# Patient Record
Sex: Female | Born: 1990 | Race: White | Hispanic: No | Smoking: Current every day smoker
Health system: Southern US, Community
[De-identification: ages and names within clinical notes are randomized; demographics above are authoritative.]

## PROBLEM LIST (undated history)

## (undated) DIAGNOSIS — K047 Periapical abscess without sinus: Secondary | ICD-10-CM

## (undated) DIAGNOSIS — J45909 Unspecified asthma, uncomplicated: Secondary | ICD-10-CM

## (undated) DIAGNOSIS — R519 Headache, unspecified: Secondary | ICD-10-CM

## (undated) DIAGNOSIS — J181 Lobar pneumonia, unspecified organism: Secondary | ICD-10-CM

## (undated) DIAGNOSIS — F419 Anxiety disorder, unspecified: Secondary | ICD-10-CM

## (undated) DIAGNOSIS — R51 Headache: Secondary | ICD-10-CM

## (undated) DIAGNOSIS — F1310 Sedative, hypnotic or anxiolytic abuse, uncomplicated: Secondary | ICD-10-CM

## (undated) HISTORY — DX: Lobar pneumonia, unspecified organism: J18.1

## (undated) HISTORY — DX: Sedative, hypnotic or anxiolytic abuse, uncomplicated: F13.10

---

## 2014-06-30 DIAGNOSIS — K047 Periapical abscess without sinus: Secondary | ICD-10-CM

## 2014-06-30 HISTORY — DX: Periapical abscess without sinus: K04.7

## 2014-11-29 LAB — OB RESULTS CONSOLE ANTIBODY SCREEN: ANTIBODY SCREEN: NEGATIVE

## 2014-11-29 LAB — OB RESULTS CONSOLE RUBELLA ANTIBODY, IGM: Rubella: IMMUNE

## 2014-11-29 LAB — OB RESULTS CONSOLE HEPATITIS B SURFACE ANTIGEN: HEP B S AG: NEGATIVE

## 2014-11-29 LAB — OB RESULTS CONSOLE RPR: RPR: NONREACTIVE

## 2014-11-29 LAB — OB RESULTS CONSOLE ABO/RH: RH TYPE: POSITIVE

## 2014-11-29 LAB — OB RESULTS CONSOLE HIV ANTIBODY (ROUTINE TESTING): HIV: NONREACTIVE

## 2015-05-08 LAB — OB RESULTS CONSOLE GBS: STREP GROUP B AG: NEGATIVE

## 2015-05-08 LAB — OB RESULTS CONSOLE GC/CHLAMYDIA
Chlamydia: NEGATIVE
Gonorrhea: NEGATIVE

## 2015-05-16 ENCOUNTER — Inpatient Hospital Stay (HOSPITAL_COMMUNITY)
Admission: AD | Admit: 2015-05-16 | Discharge: 2015-05-16 | Disposition: A | Discharge status: Home / Self Care | Payer: Medicaid Other | Source: Ambulatory Visit | Attending: Obstetrics | Admitting: Obstetrics

## 2015-05-16 ENCOUNTER — Encounter (HOSPITAL_COMMUNITY): Payer: Self-pay | Admitting: *Deleted

## 2015-05-16 DIAGNOSIS — Z3493 Encounter for supervision of normal pregnancy, unspecified, third trimester: Secondary | ICD-10-CM | POA: Insufficient documentation

## 2015-05-16 DIAGNOSIS — N898 Other specified noninflammatory disorders of vagina: Secondary | ICD-10-CM | POA: Diagnosis not present

## 2015-05-16 DIAGNOSIS — O26893 Other specified pregnancy related conditions, third trimester: Secondary | ICD-10-CM

## 2015-05-16 HISTORY — DX: Headache, unspecified: R51.9

## 2015-05-16 HISTORY — DX: Periapical abscess without sinus: K04.7

## 2015-05-16 HISTORY — DX: Anxiety disorder, unspecified: F41.9

## 2015-05-16 HISTORY — DX: Headache: R51

## 2015-05-16 HISTORY — DX: Unspecified asthma, uncomplicated: J45.909

## 2015-05-16 NOTE — MAU Note (Signed)
States noticed leaking since around 1000, clear. Happened "a few times, but then was regular." States she feels cramping and contractions.

## 2015-05-16 NOTE — Discharge Instructions (Signed)

## 2015-05-16 NOTE — MAU Note (Signed)
Urine sent to lab 

## 2015-05-16 NOTE — MAU Provider Note (Signed)
  History     CSN: [id number]  Arrival date and time: 05/16/15 1743   None     Chief Complaint  Patient presents with  . ? leaking, cramping,contractions    HPI Brianna Maddox 24 y.o. G2P0010 @ 6854w2d presents to the MAU stating that she thinks that her water might be broke. She felt her panties wet a couple of times earlier today. Denies vaginal bleeding, LOF. Reports positive fetal movement.   Past Medical History  Diagnosis Date  . Asthma   . Anxiety   . Headache   . Abscessed tooth 2016    during pregnancy, had antibiotics    History reviewed. No pertinent past surgical history.  History reviewed. No pertinent family history.  Social History  Substance Use Topics  . Smoking status: Former Smoker    Quit date: 09/13/2014  . Smokeless tobacco: Never Used  . Alcohol Use: No    Allergies: Allergies not on file  No prescriptions prior to admission    Review of Systems  Constitutional: Negative for fever.  Genitourinary:       Vaginal discharge  All other systems reviewed and are negative.  Physical Exam   Blood pressure 126/80, pulse 75, temperature 99 F (37.2 C), temperature source Oral, resp. rate 18, height 5\' 6"  (1.676 m), weight 74.844 kg (165 lb).  Physical Exam  Nursing note and vitals reviewed. Constitutional: She is oriented to person, place, and time. She appears well-developed and well-nourished.  HENT:  Head: Normocephalic and atraumatic.  Cardiovascular: Normal rate.   Respiratory: Effort normal. No respiratory distress.  GI: Soft. There is no tenderness.  Genitourinary: Vagina normal.  Neurological: She is alert and oriented to person, place, and time.  Skin: Skin is warm and dry.  Psychiatric: She has a normal mood and affect. Her behavior is normal. Judgment and thought content normal.    MAU Course  Procedures  MDM Spec Exam reveals thick white discharge. No pooling. Fern Negative. Cervix is 3/75/-2. FHR cat 1 ; irregular mild  contractions  Assessment and Plan  Vaginal Discharge  Discharge to home  [facility] HospitalClemmons,Desani Sprung Grissett 05/16/2015, 7:04 PM

## 2015-05-23 ENCOUNTER — Encounter (HOSPITAL_COMMUNITY): Payer: Self-pay

## 2015-05-23 ENCOUNTER — Inpatient Hospital Stay (HOSPITAL_COMMUNITY): Payer: Medicaid Other | Admitting: Anesthesiology

## 2015-05-23 ENCOUNTER — Inpatient Hospital Stay (HOSPITAL_COMMUNITY)
Admission: AD | Admit: 2015-05-23 | Discharge: 2015-05-27 | DRG: 766 | Disposition: A | Discharge status: Home / Self Care | Payer: Medicaid Other | Source: Intra-hospital | Attending: Obstetrics and Gynecology | Admitting: Obstetrics and Gynecology

## 2015-05-23 DIAGNOSIS — F419 Anxiety disorder, unspecified: Secondary | ICD-10-CM | POA: Diagnosis present

## 2015-05-23 DIAGNOSIS — Z87891 Personal history of nicotine dependence: Secondary | ICD-10-CM

## 2015-05-23 DIAGNOSIS — Z833 Family history of diabetes mellitus: Secondary | ICD-10-CM

## 2015-05-23 DIAGNOSIS — O99344 Other mental disorders complicating childbirth: Secondary | ICD-10-CM | POA: Diagnosis present

## 2015-05-23 DIAGNOSIS — R51 Headache: Secondary | ICD-10-CM | POA: Diagnosis not present

## 2015-05-23 DIAGNOSIS — Z3A38 38 weeks gestation of pregnancy: Secondary | ICD-10-CM | POA: Diagnosis not present

## 2015-05-23 DIAGNOSIS — O323XX0 Maternal care for face, brow and chin presentation, not applicable or unspecified: Secondary | ICD-10-CM | POA: Diagnosis present

## 2015-05-23 LAB — CBC
HCT: 37.1 % (ref 36.0–46.0)
Hemoglobin: 13.2 g/dL (ref 12.0–15.0)
MCH: 33.4 pg (ref 26.0–34.0)
MCHC: 35.6 g/dL (ref 30.0–36.0)
MCV: 93.9 fL (ref 78.0–100.0)
PLATELETS: 127 10*3/uL — AB (ref 150–400)
RBC: 3.95 MIL/uL (ref 3.87–5.11)
RDW: 13.3 % (ref 11.5–15.5)
WBC: 16.1 10*3/uL — AB (ref 4.0–10.5)

## 2015-05-23 LAB — TYPE AND SCREEN
ABO/RH(D): B POS
Antibody Screen: NEGATIVE

## 2015-05-23 MED ORDER — OXYCODONE-ACETAMINOPHEN 5-325 MG PO TABS: 1.0000 | ORAL_TABLET | ORAL | Status: DC | PRN

## 2015-05-23 MED ORDER — LIDOCAINE-EPINEPHRINE (PF) 2 %-1:200000 IJ SOLN
INTRAMUSCULAR | Status: DC | PRN
  Administered 2015-05-23: 3 mL

## 2015-05-23 MED ORDER — ACETAMINOPHEN 325 MG PO TABS: 650.0000 mg | ORAL_TABLET | ORAL | Status: DC | PRN

## 2015-05-23 MED ORDER — LIDOCAINE HCL (PF) 1 % IJ SOLN
30.0000 mL | INTRAMUSCULAR | Status: DC | PRN
  Filled 2015-05-23: qty 30

## 2015-05-23 MED ORDER — LACTATED RINGERS IV SOLN
500.0000 mL | INTRAVENOUS | Status: DC | PRN
  Administered 2015-05-23 – 2015-05-24 (×3): 500 mL via INTRAVENOUS

## 2015-05-23 MED ORDER — BUTORPHANOL TARTRATE 1 MG/ML IJ SOLN: 1.0000 mg | INTRAMUSCULAR | Status: DC | PRN

## 2015-05-23 MED ORDER — INFLUENZA VAC SPLIT QUAD 0.5 ML IM SUSY
0.5000 mL | PREFILLED_SYRINGE | INTRAMUSCULAR | Status: DC
Start: 2015-05-24 — End: 2015-05-24
  Filled 2015-05-23: qty 0.5

## 2015-05-23 MED ORDER — LACTATED RINGERS IV SOLN
INTRAVENOUS | Status: DC
  Administered 2015-05-23: 19:00:00 via INTRAVENOUS
  Administered 2015-05-24: 125 mL/h via INTRAVENOUS
  Administered 2015-05-24: 08:00:00 via INTRAVENOUS

## 2015-05-23 MED ORDER — PHENYLEPHRINE 40 MCG/ML (10ML) SYRINGE FOR IV PUSH (FOR BLOOD PRESSURE SUPPORT): 80.0000 ug | PREFILLED_SYRINGE | INTRAVENOUS | Status: DC | PRN

## 2015-05-23 MED ORDER — CITRIC ACID-SODIUM CITRATE 334-500 MG/5ML PO SOLN
30.0000 mL | ORAL | Status: DC | PRN
  Filled 2015-05-23: qty 15

## 2015-05-23 MED ORDER — FENTANYL 2.5 MCG/ML BUPIVACAINE 1/10 % EPIDURAL INFUSION (WH - ANES)
INTRAMUSCULAR | Status: AC
  Filled 2015-05-23: qty 125

## 2015-05-23 MED ORDER — OXYTOCIN 40 UNITS IN LACTATED RINGERS INFUSION - SIMPLE MED
62.5000 mL/h | INTRAVENOUS | Status: DC
  Filled 2015-05-23: qty 1000

## 2015-05-23 MED ORDER — OXYCODONE-ACETAMINOPHEN 5-325 MG PO TABS: 2.0000 | ORAL_TABLET | ORAL | Status: DC | PRN

## 2015-05-23 MED ORDER — DIPHENHYDRAMINE HCL 50 MG/ML IJ SOLN
12.5000 mg | INTRAMUSCULAR | Status: DC | PRN
  Administered 2015-05-23: 12.5 mg via INTRAVENOUS
  Filled 2015-05-23: qty 1

## 2015-05-23 MED ORDER — PHENYLEPHRINE 40 MCG/ML (10ML) SYRINGE FOR IV PUSH (FOR BLOOD PRESSURE SUPPORT)
PREFILLED_SYRINGE | INTRAVENOUS | Status: AC
  Filled 2015-05-23: qty 20

## 2015-05-23 MED ORDER — EPHEDRINE 5 MG/ML INJ: 10.0000 mg | INTRAVENOUS | Status: DC | PRN

## 2015-05-23 MED ORDER — BUPIVACAINE HCL (PF) 0.25 % IJ SOLN
INTRAMUSCULAR | Status: DC | PRN
  Administered 2015-05-23 (×2): 4 mL via EPIDURAL

## 2015-05-23 MED ORDER — ONDANSETRON HCL 4 MG/2ML IJ SOLN
4.0000 mg | Freq: Four times a day (QID) | INTRAMUSCULAR | Status: DC | PRN
  Administered 2015-05-23: 4 mg via INTRAVENOUS
  Filled 2015-05-23: qty 2

## 2015-05-23 MED ORDER — FENTANYL 2.5 MCG/ML BUPIVACAINE 1/10 % EPIDURAL INFUSION (WH - ANES)
14.0000 mL/h | INTRAMUSCULAR | Status: DC | PRN
  Administered 2015-05-23 – 2015-05-24 (×3): 14 mL/h via EPIDURAL
  Filled 2015-05-23: qty 125

## 2015-05-23 MED ORDER — OXYTOCIN BOLUS FROM INFUSION: 500.0000 mL | INTRAVENOUS | Status: DC

## 2015-05-23 NOTE — Anesthesia Preprocedure Evaluation (Signed)
Anesthesia Evaluation  Patient identified by MRN, date of birth, ID band Patient awake    Reviewed: Allergy & Precautions, Patient's Chart, lab work & pertinent test results  History of Anesthesia Complications Negative for: history of anesthetic complications  Airway Mallampati: II  TM Distance: >3 FB Neck ROM: Full    Dental  (+) Teeth Intact   Pulmonary neg pulmonary ROS, former smoker,    breath sounds clear to auscultation       Cardiovascular negative cardio ROS   Rhythm:Regular     Neuro/Psych  Headaches, Anxiety    GI/Hepatic negative GI ROS, Neg liver ROS,   Endo/Other  negative endocrine ROS  Renal/GU negative Renal ROS     Musculoskeletal   Abdominal   Peds  Hematology negative hematology ROS (+)   Anesthesia Other Findings   Reproductive/Obstetrics (+) Pregnancy                             Anesthesia Physical Anesthesia Plan  ASA: II  Anesthesia Plan: Epidural   Post-op Pain Management:    Induction:   Airway Management Planned:   Additional Equipment:   Intra-op Plan:   Post-operative Plan:   Informed Consent: I have reviewed the patients History and Physical, chart, labs and discussed the procedure including the risks, benefits and alternatives for the proposed anesthesia with the patient or authorized representative who has indicated his/her understanding and acceptance.   Dental advisory given  Plan Discussed with: Anesthesiologist  Anesthesia Plan Comments:         Anesthesia Quick Evaluation

## 2015-05-23 NOTE — H&P (Signed)
Brianna Maddox is a 24 y.o. female presenting for labor  24 yo G2P0010 @ 38+2 presents for painful contractions and was found to be in labor. Her pregnancy has been uncomplicated.  History OB History    Gravida Para Term Preterm AB TAB SAB Ectopic Multiple Living   2    1 1     0     Past Medical History  Diagnosis Date  . Asthma   . Anxiety   . Headache   . Abscessed tooth 2016    during pregnancy, had antibiotics   Past Surgical History  Procedure Laterality Date  . No past surgeries     Family History: family history includes Diabetes in her father. Social History:  reports that she quit smoking about 8 months ago. She has never used smokeless tobacco. She reports that she does not drink alcohol or use illicit drugs.   Prenatal Transfer Tool  Maternal Diabetes: No Genetic Screening: Normal Maternal Ultrasounds/Referrals: Normal Fetal Ultrasounds or other Referrals:  None Maternal Substance Abuse:  No Significant Maternal Medications:  None Significant Maternal Lab Results:  None Other Comments:  None  ROS  Dilation: 5 Effacement (%): 90 Station: -1 Exam by:: [email or web address],rnc Blood pressure 124/80, pulse 66, temperature 97.8 F (36.6 C), temperature source Oral, resp. rate 20, height 5\' 6"  (1.676 m), weight 167 lb (75.751 kg). Exam Physical Exam  Prenatal labs: ABO, Rh: B/Positive/-- (06/01 0000) Antibody: Negative (06/01 0000) Rubella: Immune (06/01 0000) RPR: Nonreactive (06/01 0000)  HBsAg: Negative (06/01 0000)  HIV: Non-reactive (06/01 0000)  GBS: Negative (11/08 0000)   Assessment/Plan: 1) Admit 2) Epidural on request    Brianna Maddox H. 05/23/2015, 7:09 PM

## 2015-05-23 NOTE — MAU Note (Signed)
Pt c/o contractions increasing all day and getting regular and stronger since 5pm. Pt denies bleeding and leaking of fluid.

## 2015-05-23 NOTE — Anesthesia Procedure Notes (Signed)
Epidural Patient location during procedure: OB  Staffing Anesthesiologist: Racheal Mathurin Performed by: anesthesiologist   Preanesthetic Checklist Completed: patient identified, surgical consent, pre-op evaluation, timeout performed, IV checked, risks and benefits discussed and monitors and equipment checked  Epidural Patient position: sitting Prep: DuraPrep Patient monitoring: heart rate, cardiac monitor, continuous pulse ox and blood pressure Approach: midline Location: L3-L4 Injection technique: LOR saline  Needle:  Needle type: Tuohy  Needle gauge: 17 G Needle length: 9 cm Needle insertion depth: 5 cm Catheter type: closed end flexible Catheter size: 19 Gauge Catheter at skin depth: 10 cm Test dose: negative and 2% lidocaine with Epi 1:200 K  Assessment Events: blood not aspirated, injection not painful, no injection resistance, negative IV test and no paresthesia  Additional Notes Reason for block:procedure for pain   

## 2015-05-24 ENCOUNTER — Encounter (HOSPITAL_COMMUNITY): Payer: Self-pay

## 2015-05-24 ENCOUNTER — Encounter (HOSPITAL_COMMUNITY)
Admission: AD | Disposition: A | Discharge status: Home / Self Care | Payer: Self-pay | Source: Ambulatory Visit | Attending: Obstetrics and Gynecology

## 2015-05-24 LAB — RPR: RPR Ser Ql: NONREACTIVE

## 2015-05-24 LAB — CBC
HEMATOCRIT: 32.6 % — AB (ref 36.0–46.0)
HEMOGLOBIN: 11.5 g/dL — AB (ref 12.0–15.0)
MCH: 33.1 pg (ref 26.0–34.0)
MCHC: 35.3 g/dL (ref 30.0–36.0)
MCV: 93.9 fL (ref 78.0–100.0)
Platelets: 108 10*3/uL — ABNORMAL LOW (ref 150–400)
RBC: 3.47 MIL/uL — ABNORMAL LOW (ref 3.87–5.11)
RDW: 13.3 % (ref 11.5–15.5)
WBC: 19.2 10*3/uL — AB (ref 4.0–10.5)

## 2015-05-24 LAB — ABO/RH: ABO/RH(D): B POS

## 2015-05-24 SURGERY — Surgical Case
Anesthesia: Epidural

## 2015-05-24 MED ORDER — DIPHENHYDRAMINE HCL 50 MG/ML IJ SOLN: 12.5000 mg | INTRAMUSCULAR | Status: DC | PRN

## 2015-05-24 MED ORDER — PHENYLEPHRINE 40 MCG/ML (10ML) SYRINGE FOR IV PUSH (FOR BLOOD PRESSURE SUPPORT)
PREFILLED_SYRINGE | INTRAVENOUS | Status: AC
  Filled 2015-05-24: qty 10

## 2015-05-24 MED ORDER — LACTATED RINGERS IV SOLN: INTRAVENOUS | Status: DC

## 2015-05-24 MED ORDER — KETOROLAC TROMETHAMINE 30 MG/ML IJ SOLN: 30.0000 mg | Freq: Four times a day (QID) | INTRAMUSCULAR | Status: AC | PRN

## 2015-05-24 MED ORDER — NALBUPHINE HCL 10 MG/ML IJ SOLN: 5.0000 mg | INTRAMUSCULAR | Status: DC | PRN

## 2015-05-24 MED ORDER — SODIUM BICARBONATE 8.4 % IV SOLN
INTRAVENOUS | Status: AC
  Filled 2015-05-24: qty 50

## 2015-05-24 MED ORDER — TETANUS-DIPHTH-ACELL PERTUSSIS 5-2.5-18.5 LF-MCG/0.5 IM SUSP
0.5000 mL | Freq: Once | INTRAMUSCULAR | Status: DC
  Filled 2015-05-24: qty 0.5

## 2015-05-24 MED ORDER — SIMETHICONE 80 MG PO CHEW
80.0000 mg | CHEWABLE_TABLET | Freq: Three times a day (TID) | ORAL | Status: DC
  Administered 2015-05-24 – 2015-05-27 (×10): 80 mg via ORAL
  Filled 2015-05-24 (×14): qty 1

## 2015-05-24 MED ORDER — MORPHINE SULFATE (PF) 0.5 MG/ML IJ SOLN
INTRAMUSCULAR | Status: AC
  Filled 2015-05-24: qty 10

## 2015-05-24 MED ORDER — DIBUCAINE 1 % RE OINT
1.0000 "application " | TOPICAL_OINTMENT | RECTAL | Status: DC | PRN
  Filled 2015-05-24: qty 28

## 2015-05-24 MED ORDER — MEPERIDINE HCL 25 MG/ML IJ SOLN: 6.2500 mg | INTRAMUSCULAR | Status: DC | PRN

## 2015-05-24 MED ORDER — DIPHENHYDRAMINE HCL 25 MG PO CAPS
25.0000 mg | ORAL_CAPSULE | Freq: Four times a day (QID) | ORAL | Status: DC | PRN
  Filled 2015-05-24: qty 1

## 2015-05-24 MED ORDER — CEFAZOLIN SODIUM-DEXTROSE 2-3 GM-% IV SOLR
INTRAVENOUS | Status: DC | PRN
  Administered 2015-05-24: 2 g via INTRAVENOUS

## 2015-05-24 MED ORDER — SODIUM CHLORIDE 0.9 % IJ SOLN: 3.0000 mL | INTRAMUSCULAR | Status: DC | PRN

## 2015-05-24 MED ORDER — OXYTOCIN 10 UNIT/ML IJ SOLN
40.0000 [IU] | INTRAVENOUS | Status: DC | PRN
  Administered 2015-05-24: 40 [IU] via INTRAVENOUS

## 2015-05-24 MED ORDER — SENNOSIDES-DOCUSATE SODIUM 8.6-50 MG PO TABS
2.0000 | ORAL_TABLET | ORAL | Status: DC
Start: 2015-05-25 — End: 2015-05-27
  Administered 2015-05-24 – 2015-05-27 (×3): 2 via ORAL
  Filled 2015-05-24 (×5): qty 2

## 2015-05-24 MED ORDER — DIPHENHYDRAMINE HCL 25 MG PO CAPS
25.0000 mg | ORAL_CAPSULE | ORAL | Status: DC | PRN
  Filled 2015-05-24: qty 1

## 2015-05-24 MED ORDER — LACTATED RINGERS IV SOLN
INTRAVENOUS | Status: DC | PRN
  Administered 2015-05-24: 09:00:00 via INTRAVENOUS

## 2015-05-24 MED ORDER — PRENATAL MULTIVITAMIN CH
1.0000 | ORAL_TABLET | Freq: Every day | ORAL | Status: DC
Start: 2015-05-24 — End: 2015-05-27
  Administered 2015-05-25 – 2015-05-27 (×2): 1 via ORAL
  Filled 2015-05-24 (×4): qty 1

## 2015-05-24 MED ORDER — SIMETHICONE 80 MG PO CHEW
80.0000 mg | CHEWABLE_TABLET | ORAL | Status: DC
  Administered 2015-05-25 – 2015-05-27 (×2): 80 mg via ORAL
  Filled 2015-05-24 (×5): qty 1

## 2015-05-24 MED ORDER — WITCH HAZEL-GLYCERIN EX PADS: 1.0000 "application " | MEDICATED_PAD | CUTANEOUS | Status: DC | PRN

## 2015-05-24 MED ORDER — NALOXONE HCL 2 MG/2ML IJ SOSY
1.0000 ug/kg/h | PREFILLED_SYRINGE | INTRAVENOUS | Status: DC | PRN
  Filled 2015-05-24: qty 2

## 2015-05-24 MED ORDER — ZOLPIDEM TARTRATE 5 MG PO TABS: 5.0000 mg | ORAL_TABLET | Freq: Every evening | ORAL | Status: DC | PRN

## 2015-05-24 MED ORDER — NALBUPHINE HCL 10 MG/ML IJ SOLN: 5.0000 mg | Freq: Once | INTRAMUSCULAR | Status: DC | PRN

## 2015-05-24 MED ORDER — LANOLIN HYDROUS EX OINT: 1.0000 "application " | TOPICAL_OINTMENT | CUTANEOUS | Status: DC | PRN

## 2015-05-24 MED ORDER — OXYCODONE-ACETAMINOPHEN 5-325 MG PO TABS
1.0000 | ORAL_TABLET | ORAL | Status: DC | PRN
  Administered 2015-05-24 – 2015-05-27 (×7): 1 via ORAL
  Filled 2015-05-24 (×7): qty 1

## 2015-05-24 MED ORDER — IBUPROFEN 600 MG PO TABS
600.0000 mg | ORAL_TABLET | Freq: Four times a day (QID) | ORAL | Status: DC
  Administered 2015-05-24 – 2015-05-27 (×12): 600 mg via ORAL
  Filled 2015-05-24 (×12): qty 1

## 2015-05-24 MED ORDER — OXYCODONE-ACETAMINOPHEN 5-325 MG PO TABS
2.0000 | ORAL_TABLET | ORAL | Status: DC | PRN
Start: 2015-05-24 — End: 2015-05-27
  Administered 2015-05-25: 2 via ORAL
  Filled 2015-05-24 (×2): qty 2

## 2015-05-24 MED ORDER — ONDANSETRON HCL 4 MG/2ML IJ SOLN
4.0000 mg | Freq: Three times a day (TID) | INTRAMUSCULAR | Status: DC | PRN
  Filled 2015-05-24: qty 2

## 2015-05-24 MED ORDER — SODIUM BICARBONATE 8.4 % IV SOLN
INTRAVENOUS | Status: DC | PRN
  Administered 2015-05-24 (×3): 5 mL via EPIDURAL

## 2015-05-24 MED ORDER — ACETAMINOPHEN 325 MG PO TABS
650.0000 mg | ORAL_TABLET | ORAL | Status: DC | PRN
  Administered 2015-05-26: 650 mg via ORAL
  Filled 2015-05-24: qty 2

## 2015-05-24 MED ORDER — MENTHOL 3 MG MT LOZG
1.0000 | LOZENGE | OROMUCOSAL | Status: DC | PRN
  Filled 2015-05-24: qty 9

## 2015-05-24 MED ORDER — 0.9 % SODIUM CHLORIDE (POUR BTL) OPTIME
TOPICAL | Status: DC | PRN
  Administered 2015-05-24: 1000 mL

## 2015-05-24 MED ORDER — OXYTOCIN 10 UNIT/ML IJ SOLN
INTRAMUSCULAR | Status: AC
Start: 2015-05-24 — End: 2015-05-24
  Filled 2015-05-24: qty 4

## 2015-05-24 MED ORDER — LACTATED RINGERS IV SOLN
INTRAVENOUS | Status: DC | PRN
  Administered 2015-05-24 (×2): via INTRAVENOUS

## 2015-05-24 MED ORDER — PHENYLEPHRINE HCL 10 MG/ML IJ SOLN
INTRAMUSCULAR | Status: DC | PRN
  Administered 2015-05-24 (×2): 40 ug via INTRAVENOUS

## 2015-05-24 MED ORDER — CEFAZOLIN SODIUM-DEXTROSE 2-3 GM-% IV SOLR
INTRAVENOUS | Status: AC
  Filled 2015-05-24: qty 50

## 2015-05-24 MED ORDER — ACETAMINOPHEN 10 MG/ML IV SOLN
1000.0000 mg | Freq: Once | INTRAVENOUS | Status: AC
  Administered 2015-05-24: 1000 mg via INTRAVENOUS
  Filled 2015-05-24: qty 100

## 2015-05-24 MED ORDER — SIMETHICONE 80 MG PO CHEW
80.0000 mg | CHEWABLE_TABLET | ORAL | Status: DC | PRN
  Administered 2015-05-24: 80 mg via ORAL
  Filled 2015-05-24 (×2): qty 1

## 2015-05-24 MED ORDER — NALOXONE HCL 0.4 MG/ML IJ SOLN: 0.4000 mg | INTRAMUSCULAR | Status: DC | PRN

## 2015-05-24 MED ORDER — PROMETHAZINE HCL 25 MG/ML IJ SOLN: 6.2500 mg | INTRAMUSCULAR | Status: DC | PRN

## 2015-05-24 MED ORDER — MEPERIDINE HCL 25 MG/ML IJ SOLN
INTRAMUSCULAR | Status: AC
  Filled 2015-05-24: qty 1

## 2015-05-24 MED ORDER — OXYTOCIN 40 UNITS IN LACTATED RINGERS INFUSION - SIMPLE MED: 62.5000 mL/h | INTRAVENOUS | Status: AC

## 2015-05-24 MED ORDER — MEPERIDINE HCL 25 MG/ML IJ SOLN
INTRAMUSCULAR | Status: DC | PRN
  Administered 2015-05-24 (×2): 12.5 mg via INTRAVENOUS

## 2015-05-24 MED ORDER — MORPHINE SULFATE (PF) 0.5 MG/ML IJ SOLN
INTRAMUSCULAR | Status: DC | PRN
  Administered 2015-05-24: 3 mg via EPIDURAL

## 2015-05-24 MED ORDER — LACTATED RINGERS IV SOLN
INTRAVENOUS | Status: DC
  Administered 2015-05-24: 1 mL via INTRAVENOUS

## 2015-05-24 MED ORDER — SCOPOLAMINE 1 MG/3DAYS TD PT72: 1.0000 | MEDICATED_PATCH | Freq: Once | TRANSDERMAL | Status: DC

## 2015-05-24 MED ORDER — ONDANSETRON HCL 4 MG/2ML IJ SOLN
INTRAMUSCULAR | Status: AC
  Filled 2015-05-24: qty 2

## 2015-05-24 MED ORDER — LIDOCAINE-EPINEPHRINE (PF) 2 %-1:200000 IJ SOLN
INTRAMUSCULAR | Status: AC
  Filled 2015-05-24: qty 20

## 2015-05-24 SURGICAL SUPPLY — 32 items
CLAMP CORD UMBIL (MISCELLANEOUS) IMPLANT
CLOTH BEACON ORANGE TIMEOUT ST (SAFETY) ×2 IMPLANT
DRAPE SHEET LG 3/4 BI-LAMINATE (DRAPES) IMPLANT
DRSG OPSITE POSTOP 4X10 (GAUZE/BANDAGES/DRESSINGS) ×2 IMPLANT
DURAPREP 26ML APPLICATOR (WOUND CARE) ×2 IMPLANT
ELECT REM PT RETURN 9FT ADLT (ELECTROSURGICAL) ×2
ELECTRODE REM PT RTRN 9FT ADLT (ELECTROSURGICAL) ×1 IMPLANT
EXTRACTOR VACUUM M CUP 4 TUBE (SUCTIONS) IMPLANT
GLOVE BIOGEL PI IND STRL 6.5 (GLOVE) ×1 IMPLANT
GLOVE BIOGEL PI IND STRL 7.0 (GLOVE) ×1 IMPLANT
GLOVE BIOGEL PI INDICATOR 6.5 (GLOVE) ×1
GLOVE BIOGEL PI INDICATOR 7.0 (GLOVE) ×1
GLOVE ECLIPSE 6.5 STRL STRAW (GLOVE) ×2 IMPLANT
GOWN STRL REUS W/TWL LRG LVL3 (GOWN DISPOSABLE) ×4 IMPLANT
KIT ABG SYR 3ML LUER SLIP (SYRINGE) IMPLANT
NEEDLE HYPO 25X5/8 SAFETYGLIDE (NEEDLE) IMPLANT
NS IRRIG 1000ML POUR BTL (IV SOLUTION) ×2 IMPLANT
PACK C SECTION WH (CUSTOM PROCEDURE TRAY) ×2 IMPLANT
PAD ABD 7.5X8 STRL (GAUZE/BANDAGES/DRESSINGS) IMPLANT
PAD OB MATERNITY 4.3X12.25 (PERSONAL CARE ITEMS) ×2 IMPLANT
PENCIL SMOKE EVAC W/HOLSTER (ELECTROSURGICAL) ×2 IMPLANT
RTRCTR C-SECT PINK 25CM LRG (MISCELLANEOUS) ×2 IMPLANT
STAPLER VISISTAT 35W (STAPLE) ×2 IMPLANT
SUT MON AB 2-0 CT1 27 (SUTURE) ×2 IMPLANT
SUT MON AB 4-0 PS1 27 (SUTURE) IMPLANT
SUT PDS AB 0 CTX 60 (SUTURE) IMPLANT
SUT PLAIN 2 0 XLH (SUTURE) IMPLANT
SUT VIC AB 0 CTX 36 (SUTURE) ×4
SUT VIC AB 0 CTX36XBRD ANBCTRL (SUTURE) ×4 IMPLANT
SUT VIC AB 4-0 KS 27 (SUTURE) IMPLANT
TOWEL OR 17X24 6PK STRL BLUE (TOWEL DISPOSABLE) ×2 IMPLANT
TRAY FOLEY CATH SILVER 14FR (SET/KITS/TRAYS/PACK) ×2 IMPLANT

## 2015-05-24 NOTE — Anesthesia Postprocedure Evaluation (Signed)
Anesthesia Post Note  Patient: Brianna PoundJessica Lacko  Procedure(s) Performed: Procedure(s) (LRB): CESAREAN SECTION (N/A)  Patient location during evaluation: PACU Anesthesia Type: Epidural Level of consciousness: awake and alert Pain management: pain level controlled Vital Signs Assessment: post-procedure vital signs reviewed and stable Respiratory status: spontaneous breathing, nonlabored ventilation and respiratory function stable Cardiovascular status: stable Postop Assessment: No headache and No backache Anesthetic complications: no    Last Vitals:  Filed Vitals:   05/24/15 1323 05/24/15 1440  BP: 110/67 107/63  Pulse: 67 72  Temp: 36.7 C 36.7 C  Resp: 18 18    Last Pain:  Filed Vitals:   05/24/15 1450  PainSc: 0-No pain                 Shelton SilvasKevin D Latrise Bowland

## 2015-05-24 NOTE — Progress Notes (Signed)
Pt started pushing approx 5am.  By my check at 8:15 am no significant progress with significant caput.  Station above +2 station, so not a vacuum candidate.  Given caput also difficult to assess sutures to determine baby position.  Lack of progress discussed with pt and decision agreed upon to proceed with primary C/S.  Pt consented and discussing R/B/Exp.

## 2015-05-24 NOTE — Transfer of Care (Signed)
Immediate Anesthesia Transfer of Care Note  Patient: Brianna PoundJessica Maddox  Procedure(s) Performed: Procedure(s): CESAREAN SECTION (N/A)  Patient Location: PACU  Anesthesia Type:Epidural  Level of Consciousness: awake, alert , oriented and patient cooperative  Airway & Oxygen Therapy: Patient Spontanous Breathing  Post-op Assessment: Report given to RN and Post -op Vital signs reviewed and stable  Post vital signs: Reviewed and stable  Last Vitals:  Filed Vitals:   05/24/15 0822 05/24/15 0848  BP:  121/85  Pulse: 85 101  Temp:    Resp:      Complications: No apparent anesthesia complications

## 2015-05-24 NOTE — Anesthesia Postprocedure Evaluation (Signed)
Anesthesia Post Note  Patient: Brianna Maddox  Procedure(s) Performed: Procedure(s) (LRB): CESAREAN SECTION (N/A)  Patient location during evaluation: Mother Baby Anesthesia Type: Epidural Level of consciousness: awake and alert and oriented Pain management: pain level controlled Vital Signs Assessment: post-procedure vital signs reviewed and stable Respiratory status: spontaneous breathing Cardiovascular status: stable Postop Assessment: No headache, No backache, Patient able to bend at knees, No signs of nausea or vomiting and Adequate PO intake Anesthetic complications: no    Last Vitals:  Filed Vitals:   05/24/15 1440 05/24/15 1521  BP: 107/63 103/63  Pulse: 72 89  Temp: 36.7 C 36.7 C  Resp: 18 20    Last Pain:  Filed Vitals:   05/24/15 1535  PainSc: 4                  Amenah Tucci

## 2015-05-24 NOTE — Op Note (Signed)
NAME:  Brianna Maddox, Brianna Maddox             ACCOUNT NO.:  [id number]  MEDICAL RECORD NO.:  [id number]  LOCATION:  9102                          FACILITY:  [facility]  PHYSICIAN:  Philip Aspen, DO    DATE OF BIRTH:  [birth year]  DATE OF PROCEDURE:  05/24/2015 DATE OF DISCHARGE:                              OPERATIVE REPORT   PREOPERATIVE DIAGNOSIS:  Arrest of descent.  POSTOPERATIVE DIAGNOSES:  Arrest of descent, OP presentation, hyperextension with brow presentation.  PROCEDURE:  Low-transverse cesarean section.  SURGEON:  Philip Aspen, DO  ASSISTANT:  Shonna Chock, MD  ANESTHESIA:  Epidural.  IV FLUIDS:  2400 mL.  URINE OUTPUT:  650 mL.  ESTIMATED BLOOD LOSS:  800 mL.  SPECIMENS:  Cord blood.  COMPLICATIONS:  None.  FINDINGS:  Female infant.  Cephalic brow presentation with Apgars of 9 and 10 and weight 9 pounds 5 ounces.  DESCRIPTION OF PROCEDURE:  After patient had been pushing for more than 3 hours with little advancement of head noted in the final hour of her pushing.  By my exam, the patient was consented for C-section and brought to the operating room.  She was prepped and draped in the normal sterile fashion in dorsal supine position with a leftward tilt.  A Pfannenstiel skin incision was made with a scalpel and carried down to the underlying layer of fascia with Bovie cautery.  Fascia was incised in the midline with the scalpel and extended laterally with Mayo scissors.  Kocher clamps were placed at the superior aspect of the fascial incision, and rectus muscles were dissected off bluntly and sharply.  Kocher clamps were then placed at the inferior aspect of the fascial incision, and rectus muscles were dissected off bluntly and sharply.  Rectus muscles were separated at the midline with hemostats and the peritoneum was entered bluntly.  The incision was extended by manual lateral traction.  The abdomen was manually surveyed.  Both tubes and ovaries were palpated and  found to be normal.  An Alexis self- retractor was placed, and the vesicouterine peritoneum was tented, entered sharply with Metzenbaum scissors, and extended laterally, and creating a bladder flap digitally.  A low transverse cesarean incision was made and the amniotic sac bulged through that incision, was entered sharply and the incision was extended by caudal and cephalic traction. The infant's head was low in the pelvis.  It was located, elevated with minimal difficulty, and the infant was then delivered without any further difficulty.  The cord was clamped and cut.  The infant was handed off to awaiting neonatology.  Cord blood was collected and external massage of the uterus with gentle traction on the umbilical cord resulted in delivery of the placenta intact.  The uterus was cleared of all clots and debris, and the uterine incision closed with Vicryl in a running locked fashion in the first layer and with horizontal Lambert imbrication in the second layer.  Excellent hemostasis was noted.  The [employer] was removed.  The peritoneum identified, reapproximated, and closed with Monocryl in a running fashion.  The fascia was then reapproximated and closed with Vicryl in a running fashion.  Subcutaneous tissue was irrigated, dried, and minimal use of  cautery was needed for control of small bleeders. The skin was reapproximated and closed with staples.  Sponge, laps, and needle counts were correct x2.  The patient tolerated the procedure well and was taken to recovery in stable condition.          ______________________________ Philip AspenSidney Shahd Occhipinti, DO     [place]/MEDQ  D:  05/24/2015  T:  05/24/2015  Job:  [id number]

## 2015-05-24 NOTE — Addendum Note (Signed)
Addendum  created 05/24/15 1640 by Renford DillsJanet L Baker Kogler, CRNA   Modules edited: Clinical Notes   Clinical Notes:  File: [id number]

## 2015-05-24 NOTE — Brief Op Note (Signed)
05/23/2015 - 05/24/2015  10:32 AM  PATIENT:  Brianna Maddox  24 y.o. female  PRE-OPERATIVE DIAGNOSIS:  Arrest of Descent  POST-OPERATIVE DIAGNOSIS:  Arrest of Descent, OP presentation, hyperextended with brow presentation  PROCEDURE:  Procedure(s): CESAREAN SECTION (N/A)  SURGEON:  Surgeon(s) and Role:    * Philip AspenSidney Genavive Kubicki, DO - Primary    * Kathrynn RunningNoah Bedford Wouk, MD - Assisting  ANESTHESIA:   epidural  EBL:  Total I/O In: 2400 [I.V.:2400] Out: 1450 [Urine:650; Blood:800]  SPECIMEN:  Source of Specimen:  cord blood  DISPOSITION OF SPECIMEN:  N/A  COUNTS:  YES  PLAN OF CARE: Admit to inpatient   PATIENT DISPOSITION:  PACU - hemodynamically stable.   Delay start of Pharmacological VTE agent (>24hrs) due to surgical blood loss or risk of bleeding: not applicable

## 2015-05-24 NOTE — Lactation Note (Signed)
This note was copied from the chart of Brianna Gavin PoundJessica Malena. Lactation Consultation Note  Patient Name: Brianna Maddox ZOXWR'UToday's Date: 05/24/2015 Reason for consult: Initial assessment Baby is 10 hrs old [facility]. Baby was born at 8837w3d & was 9+5.2# at birth. This is mom & FOBs first baby. Mom stated BF went well at the last feeding but before that had tried but he would not wake up to BF. Mom tried latching baby on left breast in football hold but baby would not wake up to BF. Demonstrated hand expression with mom & she was able to get colostrum right away. He licked her nipple but would not open wide and then was out. Left baby skin to skin with mom. Provided BF booklet, feeding logs, & BF resources; discussed LC number, outpatient services, & support groups. Mom stated she has WIC. Mom had many questions about timing of feedings (how often, length of BF), burping the baby, how to avoid engorgement, interested in pumping so dad can give bottles/ get a break, pacifier use, etc. Discussed all this with mom and encouraged mom to feed on demand or place baby skin to skin if it has been ~3.5hrs since last BF. Encouraged mom to [facility] for help with next latch.   Maternal Data Has patient been taught Hand Expression?: Yes Does the patient have breastfeeding experience prior to this delivery?: No  Feeding Feeding Type: Breast Fed Length of feed: 30 min (per mom)  LATCH Score/Interventions Latch: Too sleepy or reluctant, no latch achieved, no sucking elicited. Intervention(s): Skin to skin;Teach feeding cues  Audible Swallowing: None Intervention(s): Skin to skin;Hand expression  Type of Nipple: Everted at rest and after stimulation  Comfort (Breast/Nipple): Soft / non-tender     Hold (Positioning): Assistance needed to correctly position infant at breast and maintain latch. Intervention(s): Support Pillows;Skin to skin;Breastfeeding basics reviewed  LATCH  Score: 5  Lactation Tools Discussed/Used WIC Program: Yes   Consult Status Consult Status: Follow-up Date: 05/25/15 Follow-up type: In-patient    Oneal GroutLaura C Charnelle Bergeman 05/24/2015, 8:42 PM

## 2015-05-25 LAB — CBC
HCT: 29.9 % — ABNORMAL LOW (ref 36.0–46.0)
Hemoglobin: 10.4 g/dL — ABNORMAL LOW (ref 12.0–15.0)
MCH: 33.1 pg (ref 26.0–34.0)
MCHC: 34.8 g/dL (ref 30.0–36.0)
MCV: 95.2 fL (ref 78.0–100.0)
PLATELETS: 113 10*3/uL — AB (ref 150–400)
RBC: 3.14 MIL/uL — ABNORMAL LOW (ref 3.87–5.11)
RDW: 13.6 % (ref 11.5–15.5)
WBC: 15.6 10*3/uL — AB (ref 4.0–10.5)

## 2015-05-25 MED ORDER — BISACODYL 10 MG RE SUPP
10.0000 mg | Freq: Once | RECTAL | Status: AC
  Administered 2015-05-25: 10 mg via RECTAL
  Filled 2015-05-25 (×2): qty 1

## 2015-05-25 MED ORDER — ONDANSETRON HCL 4 MG PO TABS
8.0000 mg | ORAL_TABLET | Freq: Four times a day (QID) | ORAL | Status: DC
  Filled 2015-05-25: qty 2

## 2015-05-25 MED ORDER — PNEUMOCOCCAL VAC POLYVALENT 25 MCG/0.5ML IJ INJ
0.5000 mL | INJECTION | INTRAMUSCULAR | Status: AC
  Administered 2015-05-26: 0.5 mL via INTRAMUSCULAR
  Filled 2015-05-25: qty 0.5

## 2015-05-25 MED ORDER — INFLUENZA VAC SPLIT QUAD 0.5 ML IM SUSY
0.5000 mL | PREFILLED_SYRINGE | INTRAMUSCULAR | Status: AC
  Administered 2015-05-26: 0.5 mL via INTRAMUSCULAR

## 2015-05-25 MED ORDER — ONDANSETRON HCL 4 MG PO TABS
8.0000 mg | ORAL_TABLET | Freq: Four times a day (QID) | ORAL | Status: DC | PRN
  Administered 2015-05-25: 8 mg via ORAL

## 2015-05-25 NOTE — Lactation Note (Signed)
This note was copied from the chart of Brianna Maddox. Lactation Consultation Note  Patient Name: Brianna Maddox Reason for consult: Follow-up assessment;Other (Comment) (3% weight loss, ( 9.0.5 oz , ), LC enc mom to call when done  in  bathroom )  LC updated doc flow sheets. Voids and stools adequate for age. Baby breast feeding consistently - 18 -45 mins, Latch scores - 6-5-8 .  Mom needing to go to the bathroom and [facility] @ bedside. Mom to call when finished for feeding assessment.    Maternal Data    Feeding Feeding Type: Breast Fed Length of feed: 18 min  LATCH Score/Interventions Latch: Grasps breast easily, tongue down, lips flanged, rhythmical sucking.  Audible Swallowing: A few with stimulation  Type of Nipple: Everted at rest and after stimulation  Comfort (Breast/Nipple): Filling, red/small blisters or bruises, mild/mod discomfort  Problem noted: Mild/Moderate discomfort Interventions (Mild/moderate discomfort): Comfort gels  Hold (Positioning): No assistance needed to correctly position infant at breast. Intervention(s): Breastfeeding basics reviewed  LATCH Score: 8  Lactation Tools Discussed/Used     Consult Status Consult Status: Follow-up Date: 05/25/15 Follow-up type: In-patient    Kathrin Greathouseorio, Atlee Kluth Ann Maddox, 3:08 PM

## 2015-05-25 NOTE — Progress Notes (Signed)
Patient is doing well.  She is tolerating PO, ambulating, voiding.  Pain is controlled.  Lochia is appropriate  Filed Vitals:   05/24/15 1521 05/24/15 1900 05/24/15 2323 05/25/15 0355  BP: 103/63 95/61 107/58 91/59  Pulse: 89 87 83 75  Temp: 98.1 F (36.7 C) 98.2 F (36.8 C) 98 F (36.7 C) 98.1 F (36.7 C)  TempSrc:   Oral Oral  Resp: 20  18 18   Height:      Weight:      SpO2: 99%  99% 98%    NAD Abdomen:  soft, appropriate tenderness, incisions intact and without erythema or drainage ext:    Symmetric, no edema bilaterally  Lab Results  Component Value Date   WBC 15.6* 05/25/2015   HGB 10.4* 05/25/2015   HCT 29.9* 05/25/2015   MCV 95.2 05/25/2015   PLT 113* 05/25/2015    --/--/B POS, B POS (11/23 1920)/RImmune  A/P    23 y.o. G2P1010 POD 1 s/p 1 LTCS 2/2 arrest of descent Doing well.  Routine post op and postpartum care.   Declines circ

## 2015-05-25 NOTE — Clinical Social Work Maternal (Signed)
CLINICAL SOCIAL WORK MATERNAL/CHILD NOTE  Patient Details  Name: Brianna Maddox MRN: [id number] Date of Birth: 12-26-1990  Date:  05/25/2015  Clinical Social Worker Initiating Note:  Loleta Books, MSW, LCSW Date/ Time Initiated:  05/25/15/0915     Child's Name:  Brianna Maddox Age   Legal Guardian:  [name] and [name]  Need for Interpreter:  None   Date of Referral:  05/24/15     Reason for Referral:  History of anxiety  Referral Source:  [employer]   Address:  [address] 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  FOB was also present in the room, and was attending to the infant during the assessment.  RN accompanied CSW into MOB's room to offer pain medication.  MOB requested pain medication after she had an opportunity to eat.  MOB was pleasant, but mood and affect congruent for desire to receive pain medication. Due to pain level, level of engagement was impacted, and assessment was brief.   MOB endorsed presence of anxiety while in labor, and shared that she became nervous and scared when she learned that she needed a C-Section. She discussed have limited time to prepare, and reported that it was her first surgery.  MOB  shared that she felt less anxious and "better" once the procedure was over and she was able to see the infant; however, she noted that she continues to feel anxious when the infant makes noises since she wants to make sure that he is "okay".  MOB discussed how she has also noted that her anxiety is decreasing as she begins to learn the infant's behaviors, and that she is feeling more comfortable as he gets older.  MOB's comments highlight that she has self-awareness about her anxiety, and she smiled as her feelings of anxiety were normalized. CSW continued to provide education on impact of change in birth plan/fears during childbirth experience may impact long term mental health postpartum.     MOB reported long history of anxiety, but did not clarify onset of symptoms. Per MOB, she has previously been on Zoloft, but stated that it was "years ago".  MOB denied any increase in anxiety during the pregnancy.  She presented as attentive as CSW informed her of her increased risk for developing perinatal mood and anxiety disorders.  MOB expressed appreciation for the information and education.  She stated that she feels well supported by her support system, and discussed belief that she will be able to ask for help as needs arise.  MOB stated that she is also graduating college in the next month, but denied significant stress associated with finishing school since she notes that she has support from her  professors.  MOB denied concrete plans for post-gradation, and shared that she is taking it "one day at a time".  CSW validated and acknowledged normative range of emotions association with multiple changes (transition to motherhood, graduation, and [place]  MOB smiled as she acknowledged the numerous transitions, but did not indicate presence of lingering stress/anxieties related to these changes.   MOB denied questions, concerns, or needs at this time. She expressed appreciation  for the visit, acknowledged CSW availability, and agreed to contact CSW if needs arise during the admission.   CSW Plan/Description:   1)Patient/Family Education: Perinatal mood and anxiety disorders 2) Information/Referral to [facility] Resources: Feelings After Birth support group 3)No Further Intervention Required/No Barriers to Discharge    Kelby FamVenning, Yacine Droz N, LCSW 05/25/2015, 11:01 AM

## 2015-05-26 NOTE — Lactation Note (Signed)
This note was copied from the chart of Brianna Maddox Althaus. Lactation Consultation Note  Patient Name: Brianna Maddox Snider GNFAO'ZToday's Date: 05/26/2015 Reason for consult: Follow-up assessment Baby at 52 hr of life and has had a 9% wt loss. Mom has only been bf 6x/24hr. Encouraged mom to bf baby 8+/24hr. Mom demonstrated manual expression, colostrum noted bilaterally. She stated that she did not feed baby as much over night because she was not feeling well. Mom will bf the baby on demand, if baby does not latch she will pump, if she does not feel like she can bf or pump the baby will need a supplement. She does have the supplementing guidelines in her room. Mom is aware of OP services and support group.   Maternal Data    Feeding Feeding Type: Breast Fed Length of feed: 10 min  LATCH Score/Interventions Latch: Repeated attempts needed to sustain latch, nipple held in mouth throughout feeding, stimulation needed to elicit sucking reflex. Intervention(s): Skin to skin Intervention(s): Adjust position;Breast compression  Audible Swallowing: Spontaneous and intermittent Intervention(s): Hand expression  Type of Nipple: Everted at rest and after stimulation  Comfort (Breast/Nipple): Filling, red/small blisters or bruises, mild/mod discomfort  Problem noted: Mild/Moderate discomfort Interventions (Mild/moderate discomfort): Hand expression  Hold (Positioning): No assistance needed to correctly position infant at breast. Intervention(s): Position options  LATCH Score: 8  Lactation Tools Discussed/Used     Consult Status Consult Status: Follow-up Date: 05/27/15 Follow-up type: In-patient    Rulon Eisenmengerlizabeth E Niko Penson 05/26/2015, 3:00 PM

## 2015-05-26 NOTE — Progress Notes (Signed)
Patient is doing well.  She is tolerating PO, ambulating, voiding.  Pain is controlled.  Lochia is appropriate C/o cont gas pain, but improving since BM x 2 overnight  Filed Vitals:   05/24/15 2323 05/25/15 0355 05/25/15 1851 05/26/15 0614  BP: 107/58 91/59 103/62 108/70  Pulse: 83 75 75 67  Temp: 98 F (36.7 C) 98.1 F (36.7 C) 98.7 F (37.1 C) 98.6 F (37 C)  TempSrc: Oral Oral Oral Oral  Resp: 18 18 18 18   Height:      Weight:      SpO2: 99% 98%      NAD Abdomen:  soft, appropriate tenderness, incisions intact and without erythema or drainage ext:    Symmetric, no edema bilaterally  Lab Results  Component Value Date   WBC 15.6* 05/25/2015   HGB 10.4* 05/25/2015   HCT 29.9* 05/25/2015   MCV 95.2 05/25/2015   PLT 113* 05/25/2015    --/--/B POS, B POS (11/23 1920)/RImmune  A/P    23 y.o. G2P1010 POD 2 s/p 1 LTCS 2/2 arrest of descent Doing well.  Routine post op and postpartum care.   Declines circ

## 2015-05-27 ENCOUNTER — Ambulatory Visit: Payer: Self-pay

## 2015-05-27 MED ORDER — OXYCODONE-ACETAMINOPHEN 5-325 MG PO TABS: 1.0000 | ORAL_TABLET | Freq: Four times a day (QID) | ORAL | Status: DC | PRN

## 2015-05-27 MED ORDER — IBUPROFEN 600 MG PO TABS: 600.0000 mg | ORAL_TABLET | Freq: Four times a day (QID) | ORAL | Status: DC

## 2015-05-27 NOTE — Progress Notes (Signed)
Patient is doing well.  She is tolerating PO, ambulating, voiding.  Pain is controlled, much improved today.  Lochia is appropriate   Filed Vitals:   05/25/15 1851 05/26/15 0614 05/26/15 1800 05/27/15 0514  BP: 103/62 108/70 111/64 102/60  Pulse: 75 67 80 66  Temp: 98.7 F (37.1 C) 98.6 F (37 C) 98.7 F (37.1 C) 97.7 F (36.5 C)  TempSrc: Oral Oral Oral Oral  Resp: 18 18 19 16   Height:      Weight:      SpO2:   99%     NAD Abdomen:  soft, appropriate tenderness, incisions intact and without erythema or drainage ext:    Symmetric, no edema bilaterally  Lab Results  Component Value Date   WBC 15.6* 05/25/2015   HGB 10.4* 05/25/2015   HCT 29.9* 05/25/2015   MCV 95.2 05/25/2015   PLT 113* 05/25/2015    --/--/B POS, B POS (11/23 1920)/RImmune  A/P    24 y.o. G2P1010 POD #3 s/p 1 LTCS 2/2 arrest of descent Doing well.  Meeting all goals, d/c to home today.  Staples to be removed prior to discharge

## 2015-05-27 NOTE — Lactation Note (Signed)
This note was copied from the chart of Brianna Gavin PoundJessica Leatherwood. Lactation Consultation Note  Patient Name: Brianna Gavin PoundJessica Pech ZOXWR'UToday's Date: 05/27/2015 Reason for consult: Follow-up assessment Mom and baby are on d/c list today. Mom reports that the baby was up all night bf and she is very tired today. Discussed feeding frequency around the clock. Mom stated that the staff did not wake her to feed, suggested she set an alarm on her phone. At the last 2 feedings mom reports the baby was so frantic to eat that she had to offer formula but she did not pump even though her breast are filling/firm. Mom states that she really wants to bf/latch the baby. She is going to pump now, store the milk, and offer the baby the breast in 2 hr. She will call for lactation if she needs help. Reviewed breast changes and nipple care. Mom is aware of OP services and support group.    Maternal Data    Feeding Feeding Type: Formula  LATCH Score/Interventions                      Lactation Tools Discussed/Used     Consult Status Consult Status: Follow-up Date: 05/27/15 Follow-up type: In-patient    Rulon Eisenmengerlizabeth E Liliani Bobo 05/27/2015, 2:01 PM

## 2015-05-27 NOTE — Discharge Instructions (Signed)
You may wash incision with soap and water.   °Do not soak the incision for 2 weeks (no tub baths or swimming).   °Keep incision dry. You may need to keep a sanitary pad or panty liner between the incision and your clothing for comfort and to keep the incision dry.  If you note drainage, increased pain, or increased redness of the incision, then please notify your physician. ° °Pelvic rest x 6 weeks (no intercourse or tampons)  ° °No lifting over 10 lbs for 6 weeks.  ° °Do not drive until you are not taking narcotic pain medication AND you can comfortably slam on the brakes. ° °You have a mild anemia due to blood loss from delivery.  Please continue to take a prenatal vitamin daily.  You may experience constipation from the iron, so add a stool softener as needed. ° °

## 2015-05-27 NOTE — Discharge Summary (Signed)
Obstetric Discharge Summary Reason for Admission: onset of labor Prenatal Procedures: none Intrapartum Procedures: cesarean: low cervical, transverse for arrest of descent Postpartum Procedures: none Complications-Operative and Postpartum: none HEMOGLOBIN  Date Value Ref Range Status  05/25/2015 10.4* 12.0 - 15.0 g/dL Final   HCT  Date Value Ref Range Status  05/25/2015 29.9* 36.0 - 46.0 % Final    Physical Exam:  General: alert, cooperative and appears stated age 38Lochia: appropriate Uterine Fundus: firm Incision: healing well, no significant drainage, no significant erythema DVT Evaluation: No evidence of DVT seen on physical exam. Negative Homan's sign.  Discharge Diagnoses: Term Pregnancy-delivered  Discharge Information: Date: 05/27/2015 Activity: pelvic rest Diet: routine Medications: PNV, Ibuprofen, Colace and Percocet Condition: stable Instructions: refer to practice specific booklet Discharge to: home Follow-up Information    Follow up with CALLAHAN, SIDNEY, DO In 7 days.   Specialty:  Obstetrics and Gynecology   Why:  7-10d for incision check   Contact information:   [address] [place] [id number] [phone]       Newborn Data: Live born female  Birth Weight: 9 lb 5.2 oz (4230 g) APGAR: 9, 10  Home with mother.  Brianna Maddox Brianna Maddox 05/27/2015, 10:41 AM

## 2015-05-27 NOTE — Lactation Note (Signed)
This note was copied from the chart of Brianna Gavin PoundJessica Wallen. Lactation Consultation Note Follow up visit at 85 hours of age.  Mom reports her milk has come in today and she isn't getting the milk out.  Mom just started using ice packs  This evening and reports pumping 18mls.  Baby is showing feeding cues and mom is unsure due to baby on and off the breast.  Breasts are full, but compressible.  Mom allows baby a shallow latch and he slip more to base of nipple.  Audible swallows and gulping heard, but LC assisted with deep latch and baby is fussy not wanting to sustain a deep latch.  LC syringe fed EBM at the breast, baby sucked down 6mls then stopped, burped and would not go back to breast.  LC finger fed remaining 6mls.  Plan is for mom to ice breast for 20 minutes about every 2 hours, hand express or pump to soften breast for latch, make sure baby has deep latch and stays active for 15-20 minute feedings.  Mom to use EBM at breast with syringe  To supplement due to 11% weight loss. LC [facility] to [facility]evluate plan for home.  Mom is unsure about pumping plan at home.     Patient Name: Brianna Maddox Brianna Maddox's Date: 05/27/2015 Reason for consult: Follow-up assessment;Difficult latch;Infant weight loss   Maternal Data    Feeding Feeding Type: Breast Fed Length of feed: 8 min  LATCH Score/Interventions Latch: Repeated attempts needed to sustain latch, nipple held in mouth throughout feeding, stimulation needed to elicit sucking reflex. Intervention(s): Skin to skin;Teach feeding cues;Waking techniques Intervention(s): Breast compression;Breast massage  Audible Swallowing: Spontaneous and intermittent Intervention(s): Skin to skin;Hand expression  Type of Nipple: Everted at rest and after stimulation  Comfort (Breast/Nipple): Filling, red/small blisters or bruises, mild/mod discomfort  Problem noted: Mild/Moderate discomfort Interventions   (Cracked/bleeding/bruising/blister): Double electric pump  Hold (Positioning): Assistance needed to correctly position infant at breast and maintain latch. Intervention(s): Breastfeeding basics reviewed;Support Pillows;Position options;Skin to skin  LATCH Score: 7  Lactation Tools Discussed/Used     Consult Status      Mabel Unrein, Arvella MerlesJana Lynn 05/27/2015, 10:38 PM

## 2015-05-27 NOTE — Lactation Note (Signed)
This note was copied from the chart of Brianna Gavin PoundJessica Ruvalcaba. Lactation Consultation Note  Patient Name: Brianna Gavin PoundJessica Santacroce ZOXWR'UToday's Date: 05/27/2015 Reason for consult: Follow-up assessment Mom requested help with latch. Baby at 11 % wt loss. Baby was able to go on the Rt breast comfortably. Baby could be heard gulping. Mom's breast did get a little softer after feeding, but is still firm. Mom had pre-pumped 43 oz. She plans to post pump. Reviewed pumping and milk storage. Mom will put baby to breast 8+ times/24 hr. She is aware of OP services and support group. She will call as needed for help.    Maternal Data    Feeding Feeding Type: Breast Fed Length of feed: 25 min  LATCH Score/Interventions Latch: Repeated attempts needed to sustain latch, nipple held in mouth throughout feeding, stimulation needed to elicit sucking reflex. Intervention(s): Skin to skin Intervention(s): Assist with latch;Breast massage;Breast compression  Audible Swallowing: Spontaneous and intermittent Intervention(s): Hand expression  Type of Nipple: Everted at rest and after stimulation  Comfort (Breast/Nipple): Filling, red/small blisters or bruises, mild/mod discomfort  Problem noted: Filling Interventions (Filling): Double electric pump Interventions  (Cracked/bleeding/bruising/blister): Expressed breast milk to nipple Interventions (Mild/moderate discomfort): Hand expression;Comfort gels;Pre-pump if needed;Post-pump  Hold (Positioning): Assistance needed to correctly position infant at breast and maintain latch.  LATCH Score: 7  Lactation Tools Discussed/Used     Consult Status Consult Status: Follow-up Date: 05/28/15 Follow-up type: In-patient    Rulon Eisenmengerlizabeth E Ozell Juhasz 05/27/2015, 4:03 PM

## 2015-05-28 ENCOUNTER — Encounter (HOSPITAL_COMMUNITY): Payer: Self-pay | Admitting: Obstetrics and Gynecology

## 2015-06-06 ENCOUNTER — Ambulatory Visit (HOSPITAL_COMMUNITY): Admission: RE | Admit: 2015-06-06 | Payer: Medicaid Other | Source: Ambulatory Visit

## 2015-06-07 NOTE — Progress Notes (Signed)
Post discharge chart review completed.  

## 2015-06-14 ENCOUNTER — Ambulatory Visit (HOSPITAL_COMMUNITY): Payer: Medicaid Other

## 2017-10-29 ENCOUNTER — Other Ambulatory Visit: Payer: Self-pay | Admitting: Internal Medicine

## 2017-10-29 ENCOUNTER — Emergency Department (HOSPITAL_COMMUNITY): Payer: 59

## 2017-10-29 ENCOUNTER — Encounter (HOSPITAL_COMMUNITY): Payer: Self-pay | Admitting: Emergency Medicine

## 2017-10-29 ENCOUNTER — Observation Stay (HOSPITAL_COMMUNITY)
Admission: EM | Admit: 2017-10-29 | Discharge: 2017-10-31 | DRG: 871 | Disposition: A | Discharge status: Home / Self Care | Payer: 59 | Attending: Internal Medicine | Admitting: Internal Medicine

## 2017-10-29 ENCOUNTER — Other Ambulatory Visit: Payer: Self-pay

## 2017-10-29 DIAGNOSIS — J189 Pneumonia, unspecified organism: Secondary | ICD-10-CM

## 2017-10-29 DIAGNOSIS — J181 Lobar pneumonia, unspecified organism: Secondary | ICD-10-CM | POA: Diagnosis present

## 2017-10-29 DIAGNOSIS — J441 Chronic obstructive pulmonary disease with (acute) exacerbation: Secondary | ICD-10-CM | POA: Diagnosis present

## 2017-10-29 DIAGNOSIS — D649 Anemia, unspecified: Secondary | ICD-10-CM | POA: Diagnosis not present

## 2017-10-29 DIAGNOSIS — J45901 Unspecified asthma with (acute) exacerbation: Secondary | ICD-10-CM | POA: Diagnosis not present

## 2017-10-29 DIAGNOSIS — J45909 Unspecified asthma, uncomplicated: Secondary | ICD-10-CM | POA: Diagnosis present

## 2017-10-29 DIAGNOSIS — I10 Essential (primary) hypertension: Secondary | ICD-10-CM | POA: Diagnosis not present

## 2017-10-29 DIAGNOSIS — J159 Unspecified bacterial pneumonia: Secondary | ICD-10-CM | POA: Diagnosis not present

## 2017-10-29 DIAGNOSIS — Z87891 Personal history of nicotine dependence: Secondary | ICD-10-CM | POA: Diagnosis not present

## 2017-10-29 DIAGNOSIS — I959 Hypotension, unspecified: Secondary | ICD-10-CM

## 2017-10-29 DIAGNOSIS — R509 Fever, unspecified: Secondary | ICD-10-CM | POA: Diagnosis not present

## 2017-10-29 DIAGNOSIS — J939 Pneumothorax, unspecified: Secondary | ICD-10-CM | POA: Diagnosis not present

## 2017-10-29 DIAGNOSIS — R079 Chest pain, unspecified: Secondary | ICD-10-CM | POA: Diagnosis not present

## 2017-10-29 DIAGNOSIS — J440 Chronic obstructive pulmonary disease with acute lower respiratory infection: Secondary | ICD-10-CM | POA: Diagnosis not present

## 2017-10-29 DIAGNOSIS — A419 Sepsis, unspecified organism: Secondary | ICD-10-CM | POA: Diagnosis not present

## 2017-10-29 DIAGNOSIS — F419 Anxiety disorder, unspecified: Secondary | ICD-10-CM

## 2017-10-29 DIAGNOSIS — R05 Cough: Secondary | ICD-10-CM | POA: Diagnosis not present

## 2017-10-29 LAB — COMPREHENSIVE METABOLIC PANEL
ALT: 14 U/L (ref 14–54)
ANION GAP: 8 (ref 5–15)
AST: 14 U/L — AB (ref 15–41)
Albumin: 3.9 g/dL (ref 3.5–5.0)
Alkaline Phosphatase: 53 U/L (ref 38–126)
BILIRUBIN TOTAL: 0.6 mg/dL (ref 0.3–1.2)
BUN: 6 mg/dL (ref 6–20)
CO2: 27 mmol/L (ref 22–32)
Calcium: 9 mg/dL (ref 8.9–10.3)
Chloride: 103 mmol/L (ref 101–111)
Creatinine, Ser: 0.72 mg/dL (ref 0.44–1.00)
Glucose, Bld: 102 mg/dL — ABNORMAL HIGH (ref 65–99)
POTASSIUM: 3.5 mmol/L (ref 3.5–5.1)
Sodium: 138 mmol/L (ref 135–145)
TOTAL PROTEIN: 6.6 g/dL (ref 6.5–8.1)

## 2017-10-29 LAB — CBC WITH DIFFERENTIAL/PLATELET
BASOS ABS: 0 10*3/uL (ref 0.0–0.1)
Basophils Relative: 0 %
EOS PCT: 1 %
Eosinophils Absolute: 0.1 10*3/uL (ref 0.0–0.7)
HCT: 35.1 % — ABNORMAL LOW (ref 36.0–46.0)
HEMOGLOBIN: 11.8 g/dL — AB (ref 12.0–15.0)
LYMPHS PCT: 16 %
Lymphs Abs: 1.7 10*3/uL (ref 0.7–4.0)
MCH: 31.3 pg (ref 26.0–34.0)
MCHC: 33.6 g/dL (ref 30.0–36.0)
MCV: 93.1 fL (ref 78.0–100.0)
Monocytes Absolute: 0.6 10*3/uL (ref 0.1–1.0)
Monocytes Relative: 6 %
NEUTROS PCT: 77 %
Neutro Abs: 8.1 10*3/uL — ABNORMAL HIGH (ref 1.7–7.7)
PLATELETS: 180 10*3/uL (ref 150–400)
RBC: 3.77 MIL/uL — AB (ref 3.87–5.11)
RDW: 12.7 % (ref 11.5–15.5)
WBC: 10.5 10*3/uL (ref 4.0–10.5)

## 2017-10-29 LAB — D-DIMER, QUANTITATIVE: D-Dimer, Quant: <0.27 ug/mL-FEU (ref 0.00–0.50)

## 2017-10-29 LAB — I-STAT TROPONIN, ED: TROPONIN I, POC: 0 ng/mL (ref 0.00–0.08)

## 2017-10-29 MED ORDER — SODIUM CHLORIDE 0.9 % IV BOLUS
1000.0000 mL | Freq: Once | INTRAVENOUS | Status: AC
  Administered 2017-10-29: 1000 mL via INTRAVENOUS

## 2017-10-29 MED ORDER — KETOROLAC TROMETHAMINE 30 MG/ML IJ SOLN
30.0000 mg | Freq: Once | INTRAMUSCULAR | Status: AC
  Administered 2017-10-29: 30 mg via INTRAVENOUS
  Filled 2017-10-29: qty 1

## 2017-10-29 MED ORDER — AZITHROMYCIN 250 MG PO TABS
500.0000 mg | ORAL_TABLET | Freq: Once | ORAL | Status: AC
  Administered 2017-10-29: 500 mg via ORAL
  Filled 2017-10-29: qty 2

## 2017-10-29 MED ORDER — SODIUM CHLORIDE 0.9 % IV SOLN
1.0000 g | Freq: Once | INTRAVENOUS | Status: AC
  Administered 2017-10-29: 1 g via INTRAVENOUS
  Filled 2017-10-29: qty 10

## 2017-10-29 NOTE — ED Provider Notes (Signed)
[facility] EMERGENCY DEPARTMENT Provider Note   CSN: [id number] Arrival date & time: 10/29/17  2016     History   Chief Complaint Chief Complaint  Patient presents with  . Chest Pain    HPI Brianna Maddox is a 27 y.o. female.  The history is provided by the patient and medical records. No language interpreter was used.  Chest Pain   Associated symptoms include cough and a fever (Subjective).    Brianna Maddox is a 27 y.o. female  with a PMH of asthma, anxiety who presents to the Emergency Department complaining of productive cough, nasal congestion for a few days.  This morning, she felt as if her symptoms were worsening.  She had subjective fever and chills as well.  She was seen at [facility] medical urgent care where she reports an x-ray was done.  They stated it could be something abnormal and worsening it for the radiologist to review.  She has not heard anything from this.  She was discharged home.  She then began having sharp, right-sided chest pain.  Pain is worse with deep breathing or coughing.  She does report traveling to [place] and back (driving) in the last week and a half.  She feels as of her symptoms began today after she returned.  She is not on oral contraceptive pills.  No lower extremity pain or swelling.  Former smoker.  No history of DVT/PE/coagulopathy.  She took DayQuil prior to arrival.  No other medications taken for symptoms.  Labs were obtained in triage.  Per patient and nursing staff, upon lab draw, she had near syncopal episode.  Blood pressure was checked at that time and 72/47.   Past Medical History:  Diagnosis Date  . Abscessed tooth 2016   during pregnancy, had antibiotics  . Anxiety   . Asthma   . Headache     Patient Active Problem List   Diagnosis Date Noted  . Lobar pneumonia (HCC) 10/30/2017  . Sepsis (HCC) 10/30/2017  . Asthma   . Anxiety   . Normal labor 05/23/2015    Past Surgical History:  Procedure  Laterality Date  . CESAREAN SECTION N/A 05/24/2015   Procedure: CESAREAN SECTION;  Surgeon: Philip Aspen, DO;  Location: [facility];  Service: Obstetrics;  Laterality: N/A;  . NO PAST SURGERIES       OB History    Gravida  2   Para  1   Term  1   Preterm      AB  1   Living  0     SAB      TAB  1   Ectopic      Multiple  0   Live Births  1            Home Medications    Prior to Admission medications   Medication Sig Start Date End Date Taking? Authorizing Provider  fexofenadine (ALLEGRA ALLERGY) 180 MG tablet Take 180 mg by mouth daily.   Yes [provider]  ibuprofen (ADVIL,MOTRIN) 600 MG tablet Take 1 tablet (600 mg total) by mouth every 6 (six) hours. 05/27/15  Yes Marlow Baars, MD  Pseudoephedrine-APAP-DM (DAYQUIL MULTI-SYMPTOM PO) Take 30 mLs by mouth every 6 (six) hours as needed.   Yes [provider]  oxyCODONE-acetaminophen (PERCOCET/ROXICET) 5-325 MG tablet Take 1-2 tablets by mouth every 6 (six) hours as needed (for pain scale 4-7). Patient not taking: Reported on 10/29/2017 05/27/15   Marlow Baars, MD  Family History Family History  Problem Relation Age of Onset  . Diabetes Father     Social History Social History   Tobacco Use  . Smoking status: Former Smoker    Last attempt to quit: 09/13/2014    Years since quitting: 3.1  . Smokeless tobacco: Never Used  Substance Use Topics  . Alcohol use: No  . Drug use: No     Allergies   Apple; Prunus persica; Strawberry extract; and Peanut oil   Review of Systems Review of Systems  Constitutional: Positive for fever (Subjective).  HENT: Positive for congestion.   Respiratory: Positive for cough.   Cardiovascular: Positive for chest pain.  Musculoskeletal: Positive for myalgias.  All other systems reviewed and are negative.    Physical Exam Updated Vital Signs BP 97/67   Pulse 84   Temp 99.3 F (37.4 C) (Oral)   Resp 19   Ht  (1.626 m)   Wt 74.8 kg  (165 lb)   SpO2 97%   BMI 28.32 kg/m   Physical Exam  Constitutional: She is oriented to person, place, and time. She appears well-developed and well-nourished. No distress.  HENT:  Head: Normocephalic and atraumatic.  Cardiovascular: Normal rate, regular rhythm and normal heart sounds.  No murmur heard. Pulmonary/Chest: Effort normal and breath sounds normal. No respiratory distress.  Abdominal: Soft. She exhibits no distension. There is no tenderness.  Musculoskeletal:  No lower extremity edema or calf tenderness.  Neurological: She is alert and oriented to person, place, and time.  Skin: Skin is warm and dry.  Nursing note and vitals reviewed.    ED Treatments / Results  Labs (all labs ordered are listed, but only abnormal results are displayed) Labs Reviewed  COMPREHENSIVE METABOLIC PANEL - Abnormal; Notable for the following components:      Result Value   Glucose, Bld 102 (*)    AST 14 (*)    All other components within normal limits  CBC WITH DIFFERENTIAL/PLATELET - Abnormal; Notable for the following components:   RBC 3.77 (*)    Hemoglobin 11.8 (*)    HCT 35.1 (*)    Neutro Abs 8.1 (*)    All other components within normal limits  CULTURE, BLOOD (ROUTINE X 2)  CULTURE, BLOOD (ROUTINE X 2)  D-DIMER, QUANTITATIVE (NOT AT [facility])  LACTIC ACID, PLASMA  LACTIC ACID, PLASMA  PROCALCITONIN  I-STAT TROPONIN, ED  I-STAT BETA HCG BLOOD, ED ([facility], [facility], [facility] ONLY)    EKG None  Radiology Dg Chest 2 View  Result Date: 10/29/2017 CLINICAL DATA:  New onset weakness. EXAM: CHEST - 2 VIEW COMPARISON:  None. FINDINGS: Normal cardiac and mediastinal contours. Focal consolidation right lower hemithorax. No pleural effusion or pneumothorax. Regional skeleton is unremarkable. The lung apices are excluded from view. IMPRESSION: Consolidation within the right lower hemithorax concerning for pneumonia in the appropriate clinical setting. Followup PA and lateral chest X-ray is recommended  in 3-4 weeks following trial of antibiotic therapy to ensure resolution and exclude underlying malignancy. Electronically Signed   By: Annia Belt M.D.   On: 10/29/2017 21:04    Procedures Procedures (including critical care time)  Medications Ordered in ED Medications  sodium chloride 0.9 % bolus 1,000 mL (has no administration in time range)  loratadine (CLARITIN) tablet 10 mg (has no administration in time range)  oxyCODONE-acetaminophen (PERCOCET/ROXICET) 5-325 MG per tablet 1 tablet (has no administration in time range)  albuterol (PROVENTIL) (2.5 MG/3ML) 0.083% nebulizer solution 2.5 mg (has no administration  in time range)  ipratropium (ATROVENT) nebulizer solution 0.5 mg (has no administration in time range)  dextromethorphan-guaiFENesin (MUCINEX DM) 30-600 MG per 12 hr tablet 1 tablet (has no administration in time range)  sodium chloride 0.9 % bolus 1,000 mL (0 mLs Intravenous Stopped 10/29/17 2256)  ketorolac (TORADOL) 30 MG/ML injection 30 mg (30 mg Intravenous Given 10/29/17 2155)  sodium chloride 0.9 % bolus 1,000 mL (1,000 mLs Intravenous New Bag/Given 10/29/17 2245)  cefTRIAXone (ROCEPHIN) 1 g in sodium chloride 0.9 % 100 mL IVPB (1 g Intravenous New Bag/Given 10/29/17 2245)  azithromycin (ZITHROMAX) tablet 500 mg (500 mg Oral Given 10/29/17 2245)     Initial Impression / Assessment and Plan / ED Course  I have reviewed the triage vital signs and the nursing notes.  Pertinent labs & imaging results that were available during my care of the patient were reviewed by me and considered in my medical decision making (see chart for details).    Brianna Maddox is a 27 y.o. female who presents to ED for productive cough, subjective fever for a few days with sharp right-sided chest pain beginning today. Chest pain worse with inspiration. She did recently have a long trip to [place], therefore d-dimer obtained which was negative. CXR and clinical picture c/w PNA. Given dose of Rocephin and  azithro. Initial BP documented of 117/84. During lab draw, patient reportedly had near syncopal episode. BP was checked and noted to be 63/40. Repeated a few minutes later and was 72/47. She was given approx. 1700 cc fluids with BP slightly improved, but still hypotensive at 97/67. She notes no history of hypotension. Given acute infection with persistent hypotension, hospitalist consulted who will admit.   Patient discussed with Dr. Jeraldine Loots who agrees with treatment plan.   Final Clinical Impressions(s) / ED Diagnoses   Final diagnoses:  Hypotension, unspecified hypotension type  Community acquired pneumonia of right lung, unspecified part of lung    ED Discharge Orders    None       Ward, Chase Picket, PA-C 10/30/17 0115    Gerhard Munch, MD 10/31/17 [phone]

## 2017-10-29 NOTE — ED Provider Notes (Signed)
Patient placed in Quick Look pathway, seen and evaluated   Chief Complaint: chest pain  HPI:   Brianna Maddox is a 27 y.o. female, presenting to the ED with chest pain beginning this morning.  Worse with deep breathing and coughing.  Accompanied by subjective fever, nonproductive cough, nausea, vomiting, and diarrhea. States chest pain was the first symptom. Difficulty breathing due to pain.  Denies hematemesis, hematochezia/melena, urinary symptoms.  ROS: chest pain (one)  Physical Exam:   Gen: No distress  Neuro: Awake and Alert  Skin: Clammy, pale (patient was assessed just after blood was drawn, sedated she began to feel lightheaded afterward.)    Focused Exam:   Pulmonary: No increased work of breathing.  Speaks in full sentences without difficulty.  Lung sounds clear.  No tachypnea.  Cardiac: Tachycardic, regular. Peripheral pulses intact.  Abdominal: No abdominal tenderness.  No peritoneal signs.  No rebound tenderness.  No guarding.    MSK: Tenderness to the right side of the chest.  No peripheral edema.   Initiation of care has begun. The patient has been counseled on the process, plan, and necessity for staying for the completion/evaluation, and the remainder of the medical screening examination   Concepcion Living 10/29/17 2045    Pricilla Loveless, MD 10/30/17 [phone]

## 2017-10-29 NOTE — ED Notes (Signed)
Patient transported to X-ray 

## 2017-10-29 NOTE — ED Notes (Signed)
Husband states that pt too DayQuil at approx 5p

## 2017-10-29 NOTE — ED Notes (Signed)
Phlebotomist exited room and stated pt "passed out" while he was sticking her. BP checked and noted to be 63/40, rechecked and noted to be 72/47. Sean PA at bedside.

## 2017-10-29 NOTE — ED Triage Notes (Signed)
Pt reports R sided CP onset earlier this afternoon, pt reports she was seen at [facility] this AM for cold like S/S and was DC. Pt states pain is worse with cough, deep breathing.

## 2017-10-30 ENCOUNTER — Inpatient Hospital Stay (HOSPITAL_COMMUNITY): Payer: 59

## 2017-10-30 ENCOUNTER — Other Ambulatory Visit: Payer: Self-pay

## 2017-10-30 ENCOUNTER — Ambulatory Visit: Payer: Self-pay | Admitting: Physician Assistant

## 2017-10-30 ENCOUNTER — Encounter (HOSPITAL_COMMUNITY): Payer: Self-pay | Admitting: Internal Medicine

## 2017-10-30 DIAGNOSIS — J181 Lobar pneumonia, unspecified organism: Secondary | ICD-10-CM

## 2017-10-30 DIAGNOSIS — A419 Sepsis, unspecified organism: Secondary | ICD-10-CM

## 2017-10-30 DIAGNOSIS — J441 Chronic obstructive pulmonary disease with (acute) exacerbation: Secondary | ICD-10-CM | POA: Diagnosis not present

## 2017-10-30 DIAGNOSIS — R0602 Shortness of breath: Secondary | ICD-10-CM | POA: Diagnosis not present

## 2017-10-30 DIAGNOSIS — J440 Chronic obstructive pulmonary disease with acute lower respiratory infection: Secondary | ICD-10-CM | POA: Diagnosis not present

## 2017-10-30 DIAGNOSIS — J4520 Mild intermittent asthma, uncomplicated: Secondary | ICD-10-CM | POA: Diagnosis not present

## 2017-10-30 DIAGNOSIS — J45909 Unspecified asthma, uncomplicated: Secondary | ICD-10-CM | POA: Diagnosis present

## 2017-10-30 DIAGNOSIS — I959 Hypotension, unspecified: Secondary | ICD-10-CM | POA: Insufficient documentation

## 2017-10-30 HISTORY — DX: Sepsis, unspecified organism: A41.9

## 2017-10-30 HISTORY — DX: Lobar pneumonia, unspecified organism: J18.1

## 2017-10-30 LAB — RESPIRATORY PANEL BY PCR
Adenovirus: NOT DETECTED
Bordetella pertussis: NOT DETECTED
CORONAVIRUS 229E-RVPPCR: NOT DETECTED
CORONAVIRUS HKU1-RVPPCR: NOT DETECTED
Chlamydophila pneumoniae: NOT DETECTED
Coronavirus NL63: NOT DETECTED
Coronavirus OC43: NOT DETECTED
INFLUENZA B-RVPPCR: NOT DETECTED
Influenza A: NOT DETECTED
METAPNEUMOVIRUS-RVPPCR: DETECTED — AB
Mycoplasma pneumoniae: NOT DETECTED
PARAINFLUENZA VIRUS 1-RVPPCR: NOT DETECTED
PARAINFLUENZA VIRUS 2-RVPPCR: NOT DETECTED
Parainfluenza Virus 3: NOT DETECTED
Parainfluenza Virus 4: NOT DETECTED
RESPIRATORY SYNCYTIAL VIRUS-RVPPCR: NOT DETECTED
Rhinovirus / Enterovirus: NOT DETECTED

## 2017-10-30 LAB — LACTIC ACID, PLASMA
Lactic Acid, Venous: 1.9 mmol/L (ref 0.5–1.9)
Lactic Acid, Venous: 0.5 mmol/L (ref 0.5–1.9)

## 2017-10-30 LAB — PROCALCITONIN: PROCALCITONIN: 0.15 ng/mL

## 2017-10-30 LAB — HIV ANTIBODY (ROUTINE TESTING W REFLEX): HIV Screen 4th Generation wRfx: NONREACTIVE

## 2017-10-30 LAB — STREP PNEUMONIAE URINARY ANTIGEN: Strep Pneumo Urinary Antigen: NEGATIVE

## 2017-10-30 LAB — INFLUENZA PANEL BY PCR (TYPE A & B)
INFLAPCR: NEGATIVE
Influenza B By PCR: NEGATIVE

## 2017-10-30 MED ORDER — IOPAMIDOL (ISOVUE-370) INJECTION 76%
INTRAVENOUS | Status: AC
  Filled 2017-10-30: qty 100

## 2017-10-30 MED ORDER — DM-GUAIFENESIN ER 30-600 MG PO TB12
1.0000 | ORAL_TABLET | Freq: Two times a day (BID) | ORAL | Status: DC | PRN
  Administered 2017-10-30: 1 via ORAL
  Filled 2017-10-30: qty 1

## 2017-10-30 MED ORDER — IPRATROPIUM BROMIDE 0.02 % IN SOLN
0.5000 mg | Freq: Four times a day (QID) | RESPIRATORY_TRACT | Status: DC | PRN
Start: 2017-10-30 — End: 2017-10-31

## 2017-10-30 MED ORDER — SODIUM CHLORIDE 3 % IN NEBU
4.0000 mL | INHALATION_SOLUTION | Freq: Three times a day (TID) | RESPIRATORY_TRACT | Status: DC
  Filled 2017-10-30 (×2): qty 4

## 2017-10-30 MED ORDER — METHYLPREDNISOLONE SODIUM SUCC 125 MG IJ SOLR
60.0000 mg | Freq: Two times a day (BID) | INTRAMUSCULAR | Status: DC
  Administered 2017-10-30 – 2017-10-31 (×2): 60 mg via INTRAVENOUS
  Filled 2017-10-30 (×2): qty 2

## 2017-10-30 MED ORDER — SODIUM CHLORIDE 0.9 % IV BOLUS: 1000.0000 mL | Freq: Once | INTRAVENOUS | Status: DC

## 2017-10-30 MED ORDER — DM-GUAIFENESIN ER 30-600 MG PO TB12
1.0000 | ORAL_TABLET | Freq: Two times a day (BID) | ORAL | Status: DC
  Administered 2017-10-31: 1 via ORAL
  Filled 2017-10-30: qty 1

## 2017-10-30 MED ORDER — ALPRAZOLAM 0.25 MG PO TABS: 0.2500 mg | ORAL_TABLET | Freq: Three times a day (TID) | ORAL | Status: DC | PRN

## 2017-10-30 MED ORDER — ENOXAPARIN SODIUM 40 MG/0.4ML ~~LOC~~ SOLN: 40.0000 mg | SUBCUTANEOUS | Status: DC

## 2017-10-30 MED ORDER — OXYCODONE-ACETAMINOPHEN 5-325 MG PO TABS
1.0000 | ORAL_TABLET | Freq: Four times a day (QID) | ORAL | Status: DC | PRN
Start: 2017-10-30 — End: 2017-10-31
  Administered 2017-10-30 – 2017-10-31 (×2): 1 via ORAL
  Filled 2017-10-30 (×3): qty 1

## 2017-10-30 MED ORDER — SODIUM CHLORIDE 3 % IN NEBU
4.0000 mL | INHALATION_SOLUTION | Freq: Three times a day (TID) | RESPIRATORY_TRACT | Status: DC
  Administered 2017-10-30: 4 mL via RESPIRATORY_TRACT
  Filled 2017-10-30 (×2): qty 4

## 2017-10-30 MED ORDER — IPRATROPIUM BROMIDE 0.02 % IN SOLN
0.5000 mg | Freq: Four times a day (QID) | RESPIRATORY_TRACT | Status: DC
  Administered 2017-10-30 (×4): 0.5 mg via RESPIRATORY_TRACT
  Filled 2017-10-30 (×4): qty 2.5

## 2017-10-30 MED ORDER — ALBUTEROL SULFATE (2.5 MG/3ML) 0.083% IN NEBU: 2.5000 mg | INHALATION_SOLUTION | RESPIRATORY_TRACT | Status: DC | PRN

## 2017-10-30 MED ORDER — SODIUM CHLORIDE 0.9 % IV SOLN
1.0000 g | INTRAVENOUS | Status: DC
  Administered 2017-10-30: 1 g via INTRAVENOUS
  Filled 2017-10-30 (×2): qty 10

## 2017-10-30 MED ORDER — SODIUM CHLORIDE 0.9 % IV SOLN
500.0000 mg | INTRAVENOUS | Status: DC
  Administered 2017-10-30: 500 mg via INTRAVENOUS
  Filled 2017-10-30 (×2): qty 500

## 2017-10-30 MED ORDER — KETOROLAC TROMETHAMINE 30 MG/ML IJ SOLN
30.0000 mg | Freq: Four times a day (QID) | INTRAMUSCULAR | Status: AC | PRN
  Administered 2017-10-30: 30 mg via INTRAVENOUS
  Filled 2017-10-30: qty 1

## 2017-10-30 MED ORDER — SODIUM CHLORIDE 0.9 % IV BOLUS
1000.0000 mL | Freq: Once | INTRAVENOUS | Status: AC
  Administered 2017-10-30: 1000 mL via INTRAVENOUS

## 2017-10-30 MED ORDER — LORATADINE 10 MG PO TABS
10.0000 mg | ORAL_TABLET | Freq: Every day | ORAL | Status: DC
  Administered 2017-10-30 – 2017-10-31 (×2): 10 mg via ORAL
  Filled 2017-10-30 (×2): qty 1

## 2017-10-30 MED ORDER — SODIUM CHLORIDE 0.9 % IV SOLN
INTRAVENOUS | Status: DC
  Administered 2017-10-30: 150 mL/h via INTRAVENOUS

## 2017-10-30 MED ORDER — IOPAMIDOL (ISOVUE-370) INJECTION 76%
100.0000 mL | Freq: Once | INTRAVENOUS | Status: AC | PRN
  Administered 2017-10-30: 100 mL via INTRAVENOUS

## 2017-10-30 NOTE — H&P (Signed)
History and Physical    Brianna Maddox UJW:[id number] DOB: [birth year] DOA: 10/29/2017  Referring MD/NP/PA:   PCP: Lucky Cowboy, MD   Patient coming from:  The patient is coming from home.  At baseline, pt is independent for most of ADL.   Chief Complaint: Cough, chest pain, shortness breath, fever  HPI: Brianna Maddox is a 27 y.o. female with medical history significant of asthma, anxiety, allergy, who presents with cough, chest pain, shortness breath, fever.  Patient states that she has been having cough and recently, with a little thick mucus production.  She developed chest pain last night, which is located in the right lower chest, constant, sharp, 8 out of 10 in severity, radiating to the right shoulder and the neck, pleuritic, aggravated by deep breath.  Patient states that she had a fever of 100.5 earlier today.  She does not have a runny nose or sore throat.  She has nausea, no vomiting or abdominal pain.  She has a loose stool, and but not watery bowel movement.  She traveled to [place]  by airplane 2 weeks ago.  No tenderness in the calf areas.  ED Course: pt was found to have negative d-dimer, troponin negative, electrolytes renal function okay, WBC 10.5, temperature 99.7, hypotension with blood pressure 72/47, which responded to IV fluid resuscitation, current blood pressure 96/60, oxygen saturation 97% on room air.  Chest x-ray showed a right lower lobe infiltration.  Patient is admitted to telemetry bed as inpatient.  Review of Systems:   General: Has fevers, chills, no body weight gain, has poor appetite, has fatigue HEENT: no blurry vision, hearing changes or sore throat Respiratory: Has dyspnea, coughing, no wheezing CV: Has chest pain, no palpitations GI: no nausea, vomiting, abdominal pain, has diarrhea, no constipation GU: no dysuria, burning on urination, increased urinary frequency, hematuria  Ext: no leg edema Neuro: no unilateral weakness, numbness, or  tingling, no vision change or hearing loss Skin: no rash, no skin tear. MSK: No muscle spasm, no deformity, no limitation of range of movement in spin Heme: No easy bruising.  Travel history: No recent long distant travel.  Allergy:  Allergies  Allergen Reactions  . Apple Hives  . Prunus Persica Hives  . Strawberry Extract Hives  . Peanut Oil Itching and Other (See Comments)    Swelling of lips     Past Medical History:  Diagnosis Date  . Abscessed tooth 2016   during pregnancy, had antibiotics  . Anxiety   . Asthma   . Headache     Past Surgical History:  Procedure Laterality Date  . CESAREAN SECTION N/A 05/24/2015   Procedure: CESAREAN SECTION;  Surgeon: Philip Aspen, DO;  Location: [facility];  Service: Obstetrics;  Laterality: N/A;  . NO PAST SURGERIES      Social History:  reports that she quit smoking about 3 years ago. She has never used smokeless tobacco. She reports that she does not drink alcohol or use drugs.  Family History:  Family History  Problem Relation Age of Onset  . Diabetes Father      Prior to Admission medications   Medication Sig Start Date End Date Taking? Authorizing Provider  fexofenadine (ALLEGRA ALLERGY) 180 MG tablet Take 180 mg by mouth daily.   Yes [provider]  ibuprofen (ADVIL,MOTRIN) 600 MG tablet Take 1 tablet (600 mg total) by mouth every 6 (six) hours. 05/27/15  Yes Marlow Baars, MD  Pseudoephedrine-APAP-DM (DAYQUIL MULTI-SYMPTOM PO) Take 30 mLs by mouth every  6 (six) hours as needed.   Yes [provider]  oxyCODONE-acetaminophen (PERCOCET/ROXICET) 5-325 MG tablet Take 1-2 tablets by mouth every 6 (six) hours as needed (for pain scale 4-7). Patient not taking: Reported on 10/29/2017 05/27/15   Marlow Baars, MD    Physical Exam: Vitals:   10/30/17 0145 10/30/17 0200 10/30/17 0237 10/30/17 0258  BP: (!) 83/55 (!) 82/48 96/67   Pulse: 89 93 92 85  Resp: Temp:   99.2 F (37.3 C)   TempSrc:    Oral   SpO2: 100% 99% 99% 98%  Weight:   58.5 kg (128 lb 15.5 oz)   Height:    (1.702 m)    General: Not in acute distress HEENT:       Eyes: PERRL, EOMI, no scleral icterus.       ENT: No discharge from the ears and nose, no pharynx injection, no tonsillar enlargement.        Neck: No JVD, no bruit, no mass felt. Heme: No neck lymph node enlargement. Cardiac: S1/S2, RRR, No murmurs, No gallops or rubs. Respiratory: No rales, wheezing, rhonchi or rubs. GI: Soft, nondistended, nontender, no rebound pain, no organomegaly, BS present. GU: No hematuria Ext: No pitting leg edema bilaterally. 2+DP/PT pulse bilaterally. Musculoskeletal: No joint deformities, No joint redness or warmth, no limitation of ROM in spin. Skin: No rashes.  Neuro: Alert, oriented X3, cranial nerves II-XII grossly intact, moves all extremities normally. Psych: Patient is not psychotic, no suicidal or hemocidal ideation.  Labs on Admission: I have personally reviewed following labs and imaging studies  CBC: Recent Labs  Lab 10/29/17 2032  WBC 10.5  NEUTROABS 8.1*  HGB 11.8*  HCT 35.1*  MCV 93.1  PLT 180   Basic Metabolic Panel: Recent Labs  Lab 10/29/17 2032  NA 138  K 3.5  CL 103  CO2 27  GLUCOSE 102*  BUN 6  CREATININE 0.72  CALCIUM 9.0   GFR: Estimated Creatinine Clearance: 98.4 mL/min (by C-G formula based on SCr of 0.72 mg/dL). Liver Function Tests: Recent Labs  Lab 10/29/17 2032  AST 14*  ALT 14  ALKPHOS 53  BILITOT 0.6  PROT 6.6  ALBUMIN 3.9   No results for input(s): LIPASE, AMYLASE in the last 168 hours. No results for input(s): AMMONIA in the last 168 hours. Coagulation Profile: No results for input(s): INR, PROTIME in the last 168 hours. Cardiac Enzymes: No results for input(s): CKTOTAL, CKMB, CKMBINDEX, TROPONINI in the last 168 hours. BNP (last 3 results) No results for input(s): PROBNP in the last 8760 hours. HbA1C: No results for input(s): HGBA1C in the last 72  hours. CBG: No results for input(s): GLUCAP in the last 168 hours. Lipid Profile: No results for input(s): CHOL, HDL, LDLCALC, TRIG, CHOLHDL, LDLDIRECT in the last 72 hours. Thyroid Function Tests: No results for input(s): TSH, T4TOTAL, FREET4, T3FREE, THYROIDAB in the last 72 hours. Anemia Panel: No results for input(s): VITAMINB12, FOLATE, FERRITIN, TIBC, IRON, RETICCTPCT in the last 72 hours. Urine analysis: No results found for: COLORURINE, APPEARANCEUR, LABSPEC, PHURINE, GLUCOSEU, HGBUR, BILIRUBINUR, KETONESUR, PROTEINUR, UROBILINOGEN, NITRITE, LEUKOCYTESUR Sepsis Labs: (procalcitonin:4,lacticidven:4) )No results found for this or any previous visit (from the past 240 hour(s)).   Radiological Exams on Admission: Dg Chest 2 View  Result Date: 10/29/2017 CLINICAL DATA:  New onset weakness. EXAM: CHEST - 2 VIEW COMPARISON:  None. FINDINGS: Normal cardiac and mediastinal contours. Focal consolidation right lower hemithorax. No pleural effusion or pneumothorax.  Regional skeleton is unremarkable. The lung apices are excluded from view. IMPRESSION: Consolidation within the right lower hemithorax concerning for pneumonia in the appropriate clinical setting. Followup PA and lateral chest X-ray is recommended in 3-4 weeks following trial of antibiotic therapy to ensure resolution and exclude underlying malignancy. Electronically Signed   By: Annia Belt M.D.   On: 10/29/2017 21:04     EKG:  Not done in ED, will get one.   Assessment/Plan Principal Problem:   Lobar pneumonia (HCC) Active Problems:   Asthma   Anxiety   Sepsis (HCC)   Sepsis due to lobar pneumonia [facility]): Patient has a productive cough, pleuritic chest pain, fever and chills, infiltration on chest x-ray in the right lower lobe.  Clinically consistent with pneumonia.  A potential differential diagnosis is PE given recent long distance traveling, but the patient does not have signs of DVT and D-dimer is negative.  She  has fever and chills, clinically is more likely due to pneumonia rather than PE.  Patient meets criteria for sepsis with fever, hypotension.  Blood pressure responded to IV fluids, currently blood pressure is 96/60.  Hemodynamically stable.  Mental status normal.  - will admit to tele bed as inpt - IV Rocephin and azithromycin - Mucinex for cough  - prn Albuterol Nebs, atrovent Neb prn for SOB - Urine legionella and S. pneumococcal antigen - Follow up blood culture x2, sputum culture and respiratory virus panel, plus Flu pcr - will get Procalcitonin and trend lactic acid level per sepsis protocol - IVF: 3L of NS bolus in ED, followed by per hour of NS   Addendum: after 3L NS, pt is still hypotensive. - will get CTA to r/o PE. -will give another 1L of NS  Asthma: no wheezing or rhonchi on auscultation.  No asthma exacerbation. -Breathing treatment as above  Anxiety: -Prn xanax  DVT ppx: SQ Lovenox Code Status: Full code Family Communication: None at bed side. Disposition Plan:  Anticipate discharge back to previous home environment Consults called:  none Admission status:  Inpatient/tele      Date of Service 10/30/2017    Lorretta Harp [facility] Pager [phone]  If 7PM-7AM, please contact night-coverage [email or web address] Password [email or web address] 10/30/2017, 3:20 AM

## 2017-10-30 NOTE — ED Notes (Signed)
Pt given [place] sandwich bag per Tray(RN)

## 2017-10-30 NOTE — Progress Notes (Signed)
   10/30/17 1000  Clinical Encounter Type  Visited With Patient  Visit Type Initial  Referral From Nurse  Consult/Referral To Chaplain  Spiritual Encounters  Spiritual Needs Brochure;Emotional  Stress Factors  Patient Stress Factors Exhausted    Pt was half awake and seemingly exhausted. She was expecting her boyfriend and child any time during the day to arrive and help her go through the [facility] paperwork. Chaplain gave the POA paperwork to Pt and promised to revisit any other time when ready.  Brianna Maddox a Water quality scientist, [employer]

## 2017-10-30 NOTE — Progress Notes (Signed)
Brianna Maddox is a 27 y.o. female with medical history significant of asthma, anxiety, allergy, who presents with cough, chest pain, shortness breath, fever.  10/30/17: seen and examined. Reports cough is improved. Denies chest pain or dyspnea at rest.  Please refer to H&P dictated by Dr Clyde Lundborg on 10/30/17 for further details of the assessment and plan.

## 2017-10-31 DIAGNOSIS — J189 Pneumonia, unspecified organism: Secondary | ICD-10-CM | POA: Diagnosis not present

## 2017-10-31 DIAGNOSIS — F419 Anxiety disorder, unspecified: Secondary | ICD-10-CM

## 2017-10-31 DIAGNOSIS — A419 Sepsis, unspecified organism: Secondary | ICD-10-CM

## 2017-10-31 DIAGNOSIS — J181 Lobar pneumonia, unspecified organism: Secondary | ICD-10-CM | POA: Diagnosis not present

## 2017-10-31 DIAGNOSIS — I959 Hypotension, unspecified: Secondary | ICD-10-CM | POA: Diagnosis not present

## 2017-10-31 DIAGNOSIS — J4520 Mild intermittent asthma, uncomplicated: Secondary | ICD-10-CM

## 2017-10-31 LAB — LEGIONELLA PNEUMOPHILA SEROGP 1 UR AG: L. pneumophila Serogp 1 Ur Ag: NEGATIVE

## 2017-10-31 MED ORDER — AZITHROMYCIN 250 MG PO TABS: ORAL_TABLET | ORAL | 0 refills | Status: AC

## 2017-10-31 MED ORDER — GUAIFENESIN-DM 100-10 MG/5ML PO SYRP
5.0000 mL | ORAL_SOLUTION | ORAL | Status: DC | PRN
  Administered 2017-10-31: 5 mL via ORAL
  Filled 2017-10-31: qty 5

## 2017-10-31 MED ORDER — IPRATROPIUM BROMIDE 0.02 % IN SOLN: 0.5000 mg | Freq: Four times a day (QID) | RESPIRATORY_TRACT | 0 refills | Status: DC | PRN

## 2017-10-31 MED ORDER — AZITHROMYCIN 250 MG PO TABS
500.0000 mg | ORAL_TABLET | Freq: Every day | ORAL | Status: DC
  Administered 2017-10-31: 500 mg via ORAL
  Filled 2017-10-31: qty 2

## 2017-10-31 MED ORDER — AZITHROMYCIN 250 MG PO TABS: ORAL_TABLET | ORAL | 0 refills | Status: DC

## 2017-10-31 MED ORDER — ALBUTEROL SULFATE HFA 108 (90 BASE) MCG/ACT IN AERS: 2.0000 | INHALATION_SPRAY | Freq: Four times a day (QID) | RESPIRATORY_TRACT | 0 refills | Status: DC | PRN

## 2017-10-31 MED ORDER — PREDNISONE 5 MG PO TABS: 10.0000 mg | ORAL_TABLET | Freq: Every day | ORAL | 0 refills | Status: DC

## 2017-10-31 NOTE — Care Management (Signed)
Order placed for DME nebulizer.  Patient states she does not have nebulizer and agrees for it to be delivered from [facility] prior to d/c.  Jermaine with [facility] notified and will have delivered to room.

## 2017-10-31 NOTE — Discharge Summary (Signed)
Discharge Summary  Brianna Maddox AVW:[id number] DOB: Apr 14, 1991  PCP: Lucky Cowboy, MD  Admit date: 10/29/2017 Discharge date: 10/31/2017  Time spent: 25 minutes  Recommendations for Outpatient Follow-up:  1. Follow-up with PCP 2. Take medications as prescribed 3. Avoid tobacco use  Discharge Diagnoses:  Active Hospital Problems   Diagnosis Date Noted  . Lobar pneumonia (HCC) 10/30/2017  . Sepsis (HCC) 10/30/2017  . Asthma   . Anxiety     Resolved Hospital Problems  No resolved problems to display.    Discharge Condition: Stable  Diet recommendation: Resume previous diet  Vitals:   10/30/17 2258 10/31/17 0817  BP: 101/73 114/69  Pulse: 71 (!) 42  Resp: 16   Temp: 98.3 F (36.8 C) 97.7 F (36.5 C)  SpO2: 100% 99%    History of present illness:  Brianna Maddox a 27 y.o.femalewith medical history significant ofasthma, anxiety, allergy, who presents with cough, chest pain, shortness breath, fever.  10/30/17: seen and examined. Reports cough is improved. Denies chest pain or dyspnea at rest.  Respiratory viral panel positive for metapneumovirus.  Chest x-ray done on admission personally reviewed and revealed right lower lobe infiltrates versus atelectasis.  Continued with IV azithromycin and IV ceftriaxone, added IV steroids and around-the-clock breathing treatments with good response.   10/31/2017: Patient seen and examined at bedside.  She has been ambulatory without any dyspnea.  Cough is improving  On the day of discharge patient was hemodynamically stable.  She will need to follow-up with her PCP and take her medications as prescribed.  Hospital Course:  Principal Problem:   Lobar pneumonia [facility]) Active Problems:   Asthma   Anxiety   Sepsis (HCC)  Acute COPD exacerbation/asthma exacerbation Chest x-ray done on admission as reviewed and personally interpreted revealed right lower lobe infiltrates versus atelectasis. Completed 2 days of IV  azithromycin and IV ceftriaxone Completed 1 day of IV steroids Received around-the-clock breathing treatments Discharged on p.o. azithromycin x5 days, p.o. steroids, rescue inhaler, and nebulizer. Patient advised to follow-up with her primary care provider  History of asthma Follow-up with PCP  Former tobacco user Patient encouraged to remain tobacco free  Chronic normocytic anemia Hemoglobin stable at 11.8 No sign of overt bleeding Follow-up with primary care provider outpatient  Procedures:  None  Consultations:  None  Discharge Exam: BP 114/69 (BP Location: Left Arm)   Pulse (!) 42 Comment: also did manual (vm)  Temp 97.7 F (36.5 C) (Oral)   Resp 16   Ht  (1.702 m)   Wt 58.5 kg (128 lb 15.5 oz)   SpO2 99%   BMI 20.20 kg/m    . General: 27 y.o. year-old female well developed well nourished in no acute distress.  Alert and oriented x3. . Cardiovascular: Regular rate and rhythm with no rubs or gallops.  No thyromegaly or JVD noted.   Marland Kitchen Respiratory: Clear to auscultation with no wheezes or rales. Good inspiratory effort. . Abdomen: Soft nontender nondistended with normal bowel sounds x4 quadrants. . Musculoskeletal: No lower extremity edema. 2/4 pulses in all 4 extremities. . Skin: No ulcerative lesions noted or rashes, . Psychiatry: Mood is appropriate for condition and setting  Discharge Instructions You were cared for by a hospitalist during your hospital stay. If you have any questions about your discharge medications or the care you received while you were in the hospital after you are discharged, you can call the unit and asked to speak with the hospitalist on call if the hospitalist that  took care of you is not available. Once you are discharged, your primary care physician will handle any further medical issues. Please note that NO REFILLS for any discharge medications will be authorized once you are discharged, as it is imperative that you return to your  primary care physician (or establish a relationship with a primary care physician if you do not have one) for your aftercare needs so that they can reassess your need for medications and monitor your lab values.   Allergies as of 10/31/2017      Reactions   Apple Hives   Prunus Persica Hives   Strawberry Extract Hives   Peanut Oil Itching, Other (See Comments)   Swelling of lips      Medication List    STOP taking these medications   ALLEGRA ALLERGY 180 MG tablet Generic drug:  fexofenadine   DAYQUIL MULTI-SYMPTOM PO   ibuprofen 600 MG tablet Commonly known as:  ADVIL,MOTRIN   oxyCODONE-acetaminophen 5-325 MG tablet Commonly known as:  PERCOCET/ROXICET     TAKE these medications   albuterol 108 (90 Base) MCG/ACT inhaler Commonly known as:  PROVENTIL HFA;VENTOLIN HFA Inhale 2 puffs into the lungs every 6 (six) hours as needed for wheezing or shortness of breath.   azithromycin 250 MG tablet Commonly known as:  ZITHROMAX Z-PAK Take 2 tablets (500 mg) on  Day 1,  followed by 1 tablet (250 mg) once daily on Days 2 through 5.   azithromycin 250 MG tablet Commonly known as:  ZITHROMAX Take as instructed on the box for 5 days total.   ipratropium 0.02 % nebulizer solution Commonly known as:  ATROVENT Take 2.5 mLs (0.5 mg total) by nebulization every 6 (six) hours as needed for wheezing or shortness of breath.   predniSONE 5 MG tablet Commonly known as:  DELTASONE Take 2 tablets (10 mg total) by mouth daily. Take 30 mg x1 then 25 mg x 1, then 20 mg x 1, then 15 mg x 1, then 10 mg x 1, then 5 mg x 1      Allergies  Allergen Reactions  . Apple Hives  . Prunus Persica Hives  . Strawberry Extract Hives  . Peanut Oil Itching and Other (See Comments)    Swelling of lips    Follow-up Information    Lucky Cowboy, MD Follow up in 2 day(s).   Specialty:  Internal Medicine Why:  please call the office for an appointment. Contact information: [address] [place] [address] [phone]            The results of significant diagnostics from this hospitalization (including imaging, microbiology, ancillary and laboratory) are listed below for reference.    Significant Diagnostic Studies: Dg Chest 2 View  Result Date: 10/29/2017 CLINICAL DATA:  New onset weakness. EXAM: CHEST - 2 VIEW COMPARISON:  None. FINDINGS: Normal cardiac and mediastinal contours. Focal consolidation right lower hemithorax. No pleural effusion or pneumothorax. Regional skeleton is unremarkable. The lung apices are excluded from view. IMPRESSION: Consolidation within the right lower hemithorax concerning for pneumonia in the appropriate clinical setting. Followup PA and lateral chest X-ray is recommended in 3-4 weeks following trial of antibiotic therapy to ensure resolution and exclude underlying malignancy. Electronically Signed   By: Annia Belt M.D.   On: 10/29/2017 21:04   Ct Angio Chest Pe W Or Wo Contrast  Result Date: 10/30/2017 CLINICAL DATA:  Shortness of breath and chest pain EXAM: CT ANGIOGRAPHY CHEST WITH CONTRAST TECHNIQUE: Multidetector  CT imaging of the chest was performed using the standard protocol during bolus administration of intravenous contrast. Multiplanar CT image reconstructions and MIPs were obtained to evaluate the vascular anatomy. CONTRAST:  50 mL ISOVUE-370 IOPAMIDOL (ISOVUE-370) INJECTION 76% COMPARISON:  Chest radiograph Oct 29, 2017 FINDINGS: Cardiovascular: There is no demonstrable pulmonary embolus. There is no thoracic aortic aneurysm or dissection. Visualized great vessels appear normal. There is no appreciable pericardial effusion or pericardial thickening. Mediastinum/Nodes: Visualized thyroid appears normal. There is thymic tissue in the anterior mediastinum, normal for age. There is no appreciable thoracic adenopathy. No esophageal lesions are evident. Lungs/Pleura: There is airspace consolidation in the inferior aspect  of the medial segment right middle lobe. There is atelectatic change in each lower lobe. Lungs elsewhere are clear. No pleural effusion or pleural thickening evident. Upper Abdomen: Visualized upper abdominal structures appear normal. Musculoskeletal: There are no blastic or lytic bone lesions. There is no evident chest wall lesions. Review of the MIP images confirms the above findings. IMPRESSION: 1. No demonstrable pulmonary embolus. No thoracic aortic aneurysm or dissection. 2. Airspace consolidation consistent with pneumonia in the inferior aspect of the medial segment right middle lobe. There is atelectatic change in each lower lobe. Lungs elsewhere clear. 3.  No appreciable adenopathy. Electronically Signed   By: Bretta Bang III M.D.   On: 10/30/2017 08:48    Microbiology: Recent Results (from the past 240 hour(s))  Respiratory Panel by PCR     Status: Abnormal   Collection Time: 10/30/17  2:47 AM  Result Value Ref Range Status   Adenovirus NOT DETECTED NOT DETECTED Final   Coronavirus 229E NOT DETECTED NOT DETECTED Final   Coronavirus HKU1 NOT DETECTED NOT DETECTED Final   Coronavirus NL63 NOT DETECTED NOT DETECTED Final   Coronavirus OC43 NOT DETECTED NOT DETECTED Final   Metapneumovirus DETECTED (A) NOT DETECTED Final   Rhinovirus / Enterovirus NOT DETECTED NOT DETECTED Final   Influenza A NOT DETECTED NOT DETECTED Final   Influenza B NOT DETECTED NOT DETECTED Final   Parainfluenza Virus 1 NOT DETECTED NOT DETECTED Final   Parainfluenza Virus 2 NOT DETECTED NOT DETECTED Final   Parainfluenza Virus 3 NOT DETECTED NOT DETECTED Final   Parainfluenza Virus 4 NOT DETECTED NOT DETECTED Final   Respiratory Syncytial Virus NOT DETECTED NOT DETECTED Final   Bordetella pertussis NOT DETECTED NOT DETECTED Final   Chlamydophila pneumoniae NOT DETECTED NOT DETECTED Final   Mycoplasma pneumoniae NOT DETECTED NOT DETECTED Final    Comment: Performed at [facility], [address], [place], [place] [address]     Labs: Basic Metabolic Panel: Recent Labs  Lab 10/29/17 2032  NA 138  K 3.5  CL 103  CO2 27  GLUCOSE 102*  BUN 6  CREATININE 0.72  CALCIUM 9.0   Liver Function Tests: Recent Labs  Lab 10/29/17 2032  AST 14*  ALT 14  ALKPHOS 53  BILITOT 0.6  PROT 6.6  ALBUMIN 3.9   No results for input(s): LIPASE, AMYLASE in the last 168 hours. No results for input(s): AMMONIA in the last 168 hours. CBC: Recent Labs  Lab 10/29/17 2032  WBC 10.5  NEUTROABS 8.1*  HGB 11.8*  HCT 35.1*  MCV 93.1  PLT 180   Cardiac Enzymes: No results for input(s): CKTOTAL, CKMB, CKMBINDEX, TROPONINI in the last 168 hours. BNP: BNP (last 3 results) No results for input(s): BNP in the last 8760 hours.  ProBNP (last 3 results) No results for input(s): PROBNP  in the last 8760 hours.  CBG: No results for input(s): GLUCAP in the last 168 hours.     Signed:  Darlin Drop, MD [facility] 10/31/2017, 11:25 AM

## 2017-11-03 ENCOUNTER — Telehealth: Payer: Self-pay | Admitting: *Deleted

## 2017-11-03 NOTE — Telephone Encounter (Signed)
Called patient on 11/03/2017 , 10:28 AM in an attempt to reach the patient for a hospital follow up.   Admit date: 10/29/17 Discharge: 10/31/17   She does not  have any questions or concerns about medications from the hospital admission. The patient's medications were reviewed over the phone, they were counseled to bring in all current medications to the hospital follow up visit.   I advised the patient to call if any questions or concerns arise about the hospital admission or medications    Home health was not  started in the hospital.  All questions were answered and a follow up appointment was made.   Prior to Admission medications   Medication Sig Start Date End Date Taking? Authorizing Provider  albuterol (PROVENTIL HFA;VENTOLIN HFA) 108 (90 Base) MCG/ACT inhaler Inhale 2 puffs into the lungs every 6 (six) hours as needed for wheezing or shortness of breath. 10/31/17   Darlin Drop, DO  azithromycin (ZITHROMAX Z-PAK) 250 MG tablet Take 2 tablets (500 mg) on  Day 1,  followed by 1 tablet (250 mg) once daily on Days 2 through 5. 10/31/17 11/05/17  Darlin Drop, DO  azithromycin (ZITHROMAX) 250 MG tablet Take as instructed on the box for 5 days total. 10/31/17   Darlin Drop, DO  ipratropium (ATROVENT) 0.02 % nebulizer solution Take 2.5 mLs (0.5 mg total) by nebulization every 6 (six) hours as needed for wheezing or shortness of breath. 10/31/17   Darlin Drop, DO  predniSONE (DELTASONE) 5 MG tablet Take 2 tablets (10 mg total) by mouth daily. Take 30 mg x1 then 25 mg x 1, then 20 mg x 1, then 15 mg x 1, then 10 mg x 1, then 5 mg x 1 10/31/17   Darlin Drop, DO

## 2017-11-04 LAB — CULTURE, BLOOD (ROUTINE X 2)
CULTURE: NO GROWTH
Culture: NO GROWTH
SPECIAL REQUESTS: ADEQUATE
Special Requests: ADEQUATE

## 2017-11-08 DIAGNOSIS — D649 Anemia, unspecified: Secondary | ICD-10-CM | POA: Insufficient documentation

## 2017-11-08 NOTE — Progress Notes (Signed)
Hospital follow up and establishment of a new patient  Assessment and Plan: Hospital visit follow up for: RLL pneumonia with sepsis Hospital discharge meds were reviewed, and reconciled with the patient.   Lobar pneumonia (HCC) Check CBC, 3-4 week follow up CXR discussed and ordered  Hypotension, unspecified hypotension type Resolved, secondary to sepsis/dehydration  Mild intermittent asthma, unspecified whether complicated Continue inhaler/nebs as needed Will refer for PFTs at follow up CPE this fall - some question of asthma vs COPD while hospitalized.   Sepsis, due to unspecified organism (HCC) Resolved  Anemia, unspecified type Check CBC, iron studies B12, folate  Anxiety CBC, TSH Has been on SSRI and benzo previously; discussed alternative options After discussion will postpone any medications at this time, lifestyle discussed.   Multiple allergies Multiple severe food allergies; requesting referral for allergy testing today Will prescribe epi-pen to have as she has presented to ER due to multiple severe allergy reactions   There are no discontinued medications.  Over 40 minutes of exam, counseling, chart review, and complex, high/moderate level critical decision making was performed this visit.   Future Appointments  Date Time Provider Department Center  11/09/2017  4:15 PM [name], NP [facility] None     HPI 27 y.o.female presents for follow up for transition from recent hospitalization. Admit date to the hospital was 10/29/17, patient was discharged from the hospital on 10/31/17 and our clinical staff contacted the office the day after discharge to set up a follow up appointment. The discharge summary, medications, and diagnostic test results were reviewed before meeting with the patient. The patient was admitted for: pneumonia with acute asthma exacerbation, sepsis/hypotension.   Brianna Maddox is a 27 y.o. female, a former smoker with medical history  significant of mild intermittent asthma, anxiety, allergy, who presented to the ED on 10/29/2017 with cough, chest pain, shortness breath, fever. ED workup demonstrated negative d-dimer, troponin negative, electrolytes renal function okay, WBC 10.5, temperature 99.7, hypotension with blood pressure 72/47, which responded to IV fluid resuscitation to 96/60, oxygen saturation 97% on room air.  Chest x-ray showed a right lower lobe infiltration. Patient was admitted to telemetry bed as inpatient and treated by IV azithromycin, ceftriaxone, steroids as well as around the clock breathing treatments with good response. Respiratory viral panel positive for metapneumovirus. She was discharged on zpak, PO steroids, rescue inhaler and nebulizer. She has completed the zpak and prednisone 3 days ago; she reports she has continued to have intermittent R lower chest pain - has been using nebulizer and inhaler which fully resolves this. She also endorses new mild sore throat yesterday, but denies cough, dyspnea.   We discussed her medical history as she is a new patient to this office; she reports hx of smoking since she was 33 with consequent asthma requiring nebulized and inhaled medications. She quit smoking temporarily in 2016 with her pregnancy, though unfortunately resumed this afterwards until most recently quitting in April prior to this recent infection.   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 She reports she tapered off of these medications several years ago with improved mood. She does endorse mild anxiety at this time, but feels she is managing well by lifestyle and would like to postpone daily medications.   She also reports hx of severe food allergies - apples, strawberries, raspberries, etc which have caused angioedema which was treated at an ED. She is requesting a  prescription for epi-pen today, and is  interested in referral to allergist for food testing as she is unsure about exact causes of allergic reactions.   She endorses on going menorrhagia, reports she "soaks through" a maxi tampon in 1 hour. Not currently on contraception as she desires pregnancy with partner.   Home health is not involved.    Images while in the hospital: Dg Chest 2 View  Result Date: 10/29/2017 CLINICAL DATA:  New onset weakness. EXAM: CHEST - 2 VIEW COMPARISON:  None. FINDINGS: Normal cardiac and mediastinal contours. Focal consolidation right lower hemithorax. No pleural effusion or pneumothorax. Regional skeleton is unremarkable. The lung apices are excluded from view. IMPRESSION: Consolidation within the right lower hemithorax concerning for pneumonia in the appropriate clinical setting. Followup PA and lateral chest X-ray is recommended in 3-4 weeks following trial of antibiotic therapy to ensure resolution and exclude underlying malignancy. Electronically Signed   By: Annia Belt M.D.   On: 10/29/2017 21:04   Ct Angio Chest Pe W Or Wo Contrast  Result Date: 10/30/2017 CLINICAL DATA:  Shortness of breath and chest pain EXAM: CT ANGIOGRAPHY CHEST WITH CONTRAST TECHNIQUE: Multidetector CT imaging of the chest was performed using the standard protocol during bolus administration of intravenous contrast. Multiplanar CT image reconstructions and MIPs were obtained to evaluate the vascular anatomy. CONTRAST:  50 mL ISOVUE-370 IOPAMIDOL (ISOVUE-370) INJECTION 76% COMPARISON:  Chest radiograph Oct 29, 2017 FINDINGS: Cardiovascular: There is no demonstrable pulmonary embolus. There is no thoracic aortic aneurysm or dissection. Visualized great vessels appear normal. There is no appreciable pericardial effusion or pericardial thickening. Mediastinum/Nodes: Visualized thyroid appears normal. There is thymic tissue in the anterior mediastinum, normal for age. There is no appreciable thoracic adenopathy. No esophageal lesions are  evident. Lungs/Pleura: There is airspace consolidation in the inferior aspect of the medial segment right middle lobe. There is atelectatic change in each lower lobe. Lungs elsewhere are clear. No pleural effusion or pleural thickening evident. Upper Abdomen: Visualized upper abdominal structures appear normal. Musculoskeletal: There are no blastic or lytic bone lesions. There is no evident chest wall lesions. Review of the MIP images confirms the above findings. IMPRESSION: 1. No demonstrable pulmonary embolus. No thoracic aortic aneurysm or dissection. 2. Airspace consolidation consistent with pneumonia in the inferior aspect of the medial segment right middle lobe. There is atelectatic change in each lower lobe. Lungs elsewhere clear. 3.  No appreciable adenopathy. Electronically Signed   By: Bretta Bang III M.D.   On: 10/30/2017 08:48    Past Medical History:  Diagnosis Date  . Abscessed tooth 2016   during pregnancy, had antibiotics  . Anxiety   . Asthma   . Headache      Allergies  Allergen Reactions  . Apple Hives  . Prunus Persica Hives  . Strawberry Extract Hives  . Peanut Oil Itching and Other (See Comments)    Swelling of lips       Current Outpatient Medications on File Prior to Visit  Medication Sig Dispense Refill  . albuterol (PROVENTIL HFA;VENTOLIN HFA) 108 (90 Base) MCG/ACT inhaler Inhale 2 puffs into the lungs every 6 (six) hours as needed for wheezing or shortness of breath. 1 Inhaler 0  . azithromycin (ZITHROMAX) 250 MG tablet Take as instructed on the box for 5 days total. 6 each 0  . ipratropium (ATROVENT) 0.02 % nebulizer solution Take 2.5 mLs (0.5 mg total) by nebulization every 6 (six) hours as needed for wheezing or shortness of breath. 75  mL 0  . predniSONE (DELTASONE) 5 MG tablet Take 2 tablets (10 mg total) by mouth daily. Take 30 mg x1 then 25 mg x 1, then 20 mg x 1, then 15 mg x 1, then 10 mg x 1, then 5 mg x 1 21 tablet 0   No current  facility-administered medications on file prior to visit.     ROS: all negative except above.   Physical Exam: There were no vitals filed for this visit. There were no vitals taken for this visit. General Appearance: Well nourished, in no apparent distress. Eyes: PERRLA, EOMs, conjunctiva no swelling or erythema Sinuses: No Frontal/maxillary tenderness ENT/Mouth: Ext aud canals clear, TMs without erythema, bulging. No erythema, swelling, or exudate on post pharynx.  Tonsils not swollen or erythematous. Hearing normal.  Neck: Supple, thyroid normal.  Respiratory: Respiratory effort normal, BS equal bilaterally without rales, rhonchi, wheezing or stridor.  Cardio: RRR with no MRGs. Brisk peripheral pulses without edema.  Abdomen: Soft, + BS.  Non tender, no guarding, rebound, hernias, masses. Lymphatics: Non tender without lymphadenopathy.  Musculoskeletal: Full ROM, 5/5 strength, normal gait.  Skin: Warm, dry without rashes, lesions, ecchymosis.  Neuro: Cranial nerves intact. Normal muscle tone, no cerebellar symptoms. Sensation intact.  Psych: Awake and oriented X 3,  affect, Insight and Judgment appropriate.     Dan Maker, NP 9:35 AM [facility]

## 2017-11-09 ENCOUNTER — Ambulatory Visit (INDEPENDENT_AMBULATORY_CARE_PROVIDER_SITE_OTHER): Payer: 59 | Admitting: Adult Health

## 2017-11-09 ENCOUNTER — Encounter: Payer: Self-pay | Admitting: Adult Health

## 2017-11-09 VITALS — BP 98/58 | HR 73 | Temp 97.7°F | Ht 65.75 in | Wt 127.4 lb

## 2017-11-09 DIAGNOSIS — N920 Excessive and frequent menstruation with regular cycle: Secondary | ICD-10-CM | POA: Insufficient documentation

## 2017-11-09 DIAGNOSIS — D649 Anemia, unspecified: Secondary | ICD-10-CM

## 2017-11-09 DIAGNOSIS — F419 Anxiety disorder, unspecified: Secondary | ICD-10-CM

## 2017-11-09 DIAGNOSIS — J4520 Mild intermittent asthma, uncomplicated: Secondary | ICD-10-CM | POA: Diagnosis not present

## 2017-11-09 DIAGNOSIS — Z79899 Other long term (current) drug therapy: Secondary | ICD-10-CM

## 2017-11-09 DIAGNOSIS — I959 Hypotension, unspecified: Secondary | ICD-10-CM | POA: Diagnosis not present

## 2017-11-09 DIAGNOSIS — Z889 Allergy status to unspecified drugs, medicaments and biological substances status: Secondary | ICD-10-CM

## 2017-11-09 DIAGNOSIS — A419 Sepsis, unspecified organism: Secondary | ICD-10-CM | POA: Diagnosis not present

## 2017-11-09 DIAGNOSIS — J181 Lobar pneumonia, unspecified organism: Secondary | ICD-10-CM | POA: Diagnosis not present

## 2017-11-09 DIAGNOSIS — Z91018 Allergy to other foods: Secondary | ICD-10-CM | POA: Diagnosis not present

## 2017-11-09 MED ORDER — EPINEPHRINE 0.3 MG/0.3ML IJ SOAJ: 0.3000 mg | Freq: Once | INTRAMUSCULAR | 1 refills | Status: AC

## 2017-11-09 NOTE — Patient Instructions (Signed)
Meds that we talked about today: Prozac (long acting SSRI) or celexa  Buspar - or buspirone - another short-acting medication that we can add on if you need for anxiety.   Remember to go have the chest xray done after 5/25th  Message me if your cough is getting worse, having more chest pain, coughing mucus up, etc  I'll see you back this fall for a physical and we can talk about referring to pulmonology for testing

## 2017-11-10 LAB — URINALYSIS, COMPLETE
BACTERIA UA: NONE SEEN /HPF
Bilirubin Urine: NEGATIVE
GLUCOSE, UA: NEGATIVE
HGB URINE DIPSTICK: NEGATIVE
Hyaline Cast: NONE SEEN /LPF
KETONES UR: NEGATIVE
LEUKOCYTES UA: NEGATIVE
Nitrite: NEGATIVE
PROTEIN: NEGATIVE
RBC / HPF: NONE SEEN /HPF (ref 0–2)
Specific Gravity, Urine: 1.008 (ref 1.001–1.03)
pH: 6.5 (ref 5.0–8.0)

## 2017-11-10 LAB — CBC WITH DIFFERENTIAL/PLATELET
BASOS ABS: 83 {cells}/uL (ref 0–200)
Basophils Relative: 1 %
EOS ABS: 556 {cells}/uL — AB (ref 15–500)
Eosinophils Relative: 6.7 %
HCT: 37.1 % (ref 35.0–45.0)
HEMOGLOBIN: 12.8 g/dL (ref 11.7–15.5)
Lymphs Abs: 2623 cells/uL (ref 850–3900)
MCH: 31.3 pg (ref 27.0–33.0)
MCHC: 34.5 g/dL (ref 32.0–36.0)
MCV: 90.7 fL (ref 80.0–100.0)
MPV: 10.9 fL (ref 7.5–12.5)
Monocytes Relative: 7.9 %
NEUTROS ABS: 4382 {cells}/uL (ref 1500–7800)
NEUTROS PCT: 52.8 %
Platelets: 345 10*3/uL (ref 140–400)
RBC: 4.09 10*6/uL (ref 3.80–5.10)
RDW: 12 % (ref 11.0–15.0)
Total Lymphocyte: 31.6 %
WBC: 8.3 10*3/uL (ref 3.8–10.8)
WBCMIX: 656 {cells}/uL (ref 200–950)

## 2017-11-10 LAB — FOLATE RBC: RBC Folate: 744 ng/mL RBC (ref >280)

## 2017-11-10 LAB — IRON, TOTAL/TOTAL IRON BINDING CAP
%SAT: 22 % (calc) (ref 11–50)
Iron: 85 ug/dL (ref 40–190)
TIBC: 384 mcg/dL (calc) (ref 250–450)

## 2017-11-10 LAB — VITAMIN B12: Vitamin B-12: 904 pg/mL (ref 200–1100)

## 2017-11-10 LAB — TSH: TSH: 1.91 mIU/L

## 2017-12-14 NOTE — Progress Notes (Deleted)
Assessment and Plan:  There are no diagnoses linked to this encounter.    Further disposition pending results of labs. Discussed med's effects and SE's.   Over 15 minutes of exam, counseling, chart review, and critical decision making was performed.   Future Appointments  Date Time Provider Department Center  12/15/2017  3:45 PM Judd Gaudierorbett, Norine Reddington, NP [facility] None  04/06/2018  3:00 PM Quentin Mullingollier, Amanda, PA-C [facility] None    ------------------------------------------------------------------------------------------------------------------   HPI 27 y.o.female, a former smoker with hx of asthma and recent admission for lobar pneumonia with sepsis on 10/29/2017  presents for evaluation of cough   Past Medical History:  Diagnosis Date  . Abscessed tooth 2016   during pregnancy, had antibiotics  . Anxiety   . Asthma   . Headache      Allergies  Allergen Reactions  . Apple Hives  . Prunus Persica Hives  . Raspberry Hives and Swelling  . Strawberry Extract Hives  . Peanut Oil Itching and Other (See Comments)    Swelling of lips     Current Outpatient Medications on File Prior to Visit  Medication Sig  . albuterol (PROVENTIL HFA;VENTOLIN HFA) 108 (90 Base) MCG/ACT inhaler Inhale 2 puffs into the lungs every 6 (six) hours as needed for wheezing or shortness of breath.  Marland Kitchen. azithromycin (ZITHROMAX) 250 MG tablet Take as instructed on the box for 5 days total. (Patient not taking: Reported on 11/09/2017)  . ipratropium (ATROVENT) 0.02 % nebulizer solution Take 2.5 mLs (0.5 mg total) by nebulization every 6 (six) hours as needed for wheezing or shortness of breath.  . predniSONE (DELTASONE) 5 MG tablet Take 2 tablets (10 mg total) by mouth daily. Take 30 mg x1 then 25 mg x 1, then 20 mg x 1, then 15 mg x 1, then 10 mg x 1, then 5 mg x 1 (Patient not taking: Reported on 11/09/2017)   No current facility-administered medications on file prior to visit.     ROS: all negative except  above.   Physical Exam:  There were no vitals taken for this visit.  General Appearance: Well nourished, in no apparent distress. Eyes: PERRLA, EOMs, conjunctiva no swelling or erythema Sinuses: No Frontal/maxillary tenderness ENT/Mouth: Ext aud canals clear, TMs without erythema, bulging. No erythema, swelling, or exudate on post pharynx.  Tonsils not swollen or erythematous. Hearing normal.  Neck: Supple, thyroid normal.  Respiratory: Respiratory effort normal, BS equal bilaterally without rales, rhonchi, wheezing or stridor.  Cardio: RRR with no MRGs. Brisk peripheral pulses without edema.  Abdomen: Soft, + BS.  Non tender, no guarding, rebound, hernias, masses. Lymphatics: Non tender without lymphadenopathy.  Musculoskeletal: Full ROM, 5/5 strength, normal gait.  Skin: Warm, dry without rashes, lesions, ecchymosis.  Neuro: Cranial nerves intact. Normal muscle tone, no cerebellar symptoms. Sensation intact.  Psych: Awake and oriented X 3, normal affect, Insight and Judgment appropriate.     Dan MakerAshley C Frona Yost, NP 10:29 AM [facility]

## 2017-12-15 ENCOUNTER — Encounter: Payer: Self-pay | Admitting: Adult Health

## 2017-12-15 ENCOUNTER — Ambulatory Visit: Payer: Self-pay | Admitting: Adult Health

## 2017-12-15 ENCOUNTER — Ambulatory Visit (INDEPENDENT_AMBULATORY_CARE_PROVIDER_SITE_OTHER): Payer: 59 | Admitting: Adult Health

## 2017-12-15 VITALS — BP 90/58 | HR 88 | Temp 97.7°F | Ht 65.75 in | Wt 127.0 lb

## 2017-12-15 DIAGNOSIS — Z8701 Personal history of pneumonia (recurrent): Secondary | ICD-10-CM

## 2017-12-15 DIAGNOSIS — R05 Cough: Secondary | ICD-10-CM | POA: Diagnosis not present

## 2017-12-15 DIAGNOSIS — R058 Other specified cough: Secondary | ICD-10-CM

## 2017-12-15 MED ORDER — LEVOFLOXACIN 750 MG PO TABS: 750.0000 mg | ORAL_TABLET | Freq: Every day | ORAL | 0 refills | Status: AC

## 2017-12-15 MED ORDER — PREDNISONE 20 MG PO TABS: ORAL_TABLET | ORAL | 0 refills | Status: DC

## 2017-12-15 NOTE — Progress Notes (Signed)
Assessment and Plan:  Recurrent productive cough/ history of recent pneumonia Recovering from recent pneumonia with sepsis; has been on zpak, rocephin recently Hasn't had follow up CXR yet, will have her do this tomorrow Continue robitussin for cough Follow up for any new/worsening symptoms, ER for severe sudden symptoms -     levofloxacin (LEVAQUIN) 750 MG tablet; Take 1 tablet (750 mg total) by mouth daily for 5 days. -     predniSONE (DELTASONE) 20 MG tablet; 2 tablets daily for 3 days, 1 tablet daily for 4 days. -     DG Chest 2 View; Future  Will refer to pulmology for PFTs eval once resolved and at baseline or sooner if ongoing complications  Further disposition pending results of labs. Discussed med's effects and SE's.   Over 15 minutes of exam, counseling, chart review, and critical decision making was performed.   Future Appointments  Date Time Provider Department Center  04/06/2018  3:00 PM Quentin Mullingollier, Amanda, PA-C [facility] None    ------------------------------------------------------------------------------------------------------------------   HPI BP (!) 90/58   Pulse 88   Temp 97.7 F (36.5 C)   Ht 5' 5.75" (1.67 m)   Wt 127 lb (57.6 kg)   SpO2 97%   BMI 20.65 kg/m   27 y.o.female recent former smoker with recent hx of pneumonia with sepsis presents for evaluation of a new cough which started 3 weeks after completion of previous abx and prednisone; she reports symptoms began 2 weeks ago with nasal congestion and productive cough, some mild chest pains at night, denies dyspnea, fever/chills. She reports congestion is improving, but still producing a lot of mucus from her nose, and still has sense of chest congestion. Speaks in complete sentences without apparent distress.    She has been taking robitussin at night for cough to help with sleep.    Past Medical History:  Diagnosis Date  . Abscessed tooth 2016   during pregnancy, had antibiotics  . Anxiety   .  Asthma   . Headache      Allergies  Allergen Reactions  . Apple Hives  . Prunus Persica Hives  . Raspberry Hives and Swelling  . Strawberry Extract Hives  . Peanut Oil Itching and Other (See Comments)    Swelling of lips     Current Outpatient Medications on File Prior to Visit  Medication Sig  . albuterol (PROVENTIL HFA;VENTOLIN HFA) 108 (90 Base) MCG/ACT inhaler Inhale 2 puffs into the lungs every 6 (six) hours as needed for wheezing or shortness of breath.  Marland Kitchen. ipratropium (ATROVENT) 0.02 % nebulizer solution Take 2.5 mLs (0.5 mg total) by nebulization every 6 (six) hours as needed for wheezing or shortness of breath.  Marland Kitchen. azithromycin (ZITHROMAX) 250 MG tablet Take as instructed on the box for 5 days total. (Patient not taking: Reported on 11/09/2017)  . predniSONE (DELTASONE) 5 MG tablet Take 2 tablets (10 mg total) by mouth daily. Take 30 mg x1 then 25 mg x 1, then 20 mg x 1, then 15 mg x 1, then 10 mg x 1, then 5 mg x 1 (Patient not taking: Reported on 11/09/2017)   No current facility-administered medications on file prior to visit.     ROS: all negative except above.   Physical Exam:  BP (!) 90/58   Pulse 88   Temp 97.7 F (36.5 C)   Ht 5' 5.75" (1.67 m)   Wt 127 lb (57.6 kg)   SpO2 97%   BMI 20.65 kg/m   General  Appearance: Well nourished, in no apparent distress. Eyes: PERRLA, EOMs, conjunctiva no swelling or erythema Sinuses: No Frontal/maxillary tenderness ENT/Mouth: Ext aud canals clear, TMs without erythema, bulging. No erythema, swelling, or exudate on post pharynx.  Tonsils not swollen or erythematous. Hearing normal.  Neck: Supple, thyroid normal.  Respiratory: Respiratory effort normal, BS present throughout bilaterally, without rhonchi, wheezing or stridor but with ?mildly diminished in bilateral bases, scant crackles Cardio: RRR with no MRGs. Brisk peripheral pulses without edema.  Abdomen: Soft, + BS.  Non tender, no guarding, rebound, hernias,  masses. Lymphatics: Non tender without lymphadenopathy.  Musculoskeletal: Symmetrical strength, normal gait.  Skin: Warm, dry without rashes, lesions, ecchymosis.  Neuro: Cranial nerves intact. Normal muscle tone, no cerebellar symptoms. Sensation intact.  Psych: Awake and oriented X 3, normal affect, Insight and Judgment appropriate.     Dan Maker, NP 4:20 PM [facility]

## 2017-12-15 NOTE — Patient Instructions (Addendum)
Keratosis pilaris - skin bumps  Ab separation - diastasis recti - look for specific exercises    Get on allergra or zyrtec  Add mucinex or generic equivalent for congestion   HOW TO TREAT VIRAL COUGH AND COLD SYMPTOMS:  -Symptoms usually last at least 1 week with the worst symptoms being around day 4.  - colds usually start with a sore throat and end with a cough, and the cough can take 2 weeks to get better.  -No antibiotics are needed for colds, flu, sore throats, cough, bronchitis UNLESS symptoms are longer than 7 days OR if you are getting better then get drastically worse.  -There are a lot of combination medications (Dayquil, Nyquil, Vicks 44, tyelnol cold and sinus, ETC). Please look at the ingredients on the back so that you are treating the correct symptoms and not doubling up on medications/ingredients.    Medicines you can use  Nasal congestion  Little Remedies saline spray (aerosol/mist)- can try this, it is in the kids section - pseudoephedrine (Sudafed)- behind the counter, do not use if you have high blood pressure, medicine that have -D in them.  - phenylephrine (Sudafed PE) -Dextormethorphan + chlorpheniramine (Coridcidin HBP)- okay if you have high blood pressure -Oxymetazoline (Afrin) nasal spray- LIMIT to 3 days -Saline nasal spray -Neti pot (used distilled or bottled water)  Ear pain/congestion  -pseudoephedrine (sudafed) - Nasonex/flonase nasal spray  Fever  -Acetaminophen (Tyelnol) -Ibuprofen (Advil, motrin, aleve)  Sore Throat  -Acetaminophen (Tyelnol) -Ibuprofen (Advil, motrin, aleve) -Drink a lot of water -Gargle with salt water - Rest your voice (don't talk) -Throat sprays -Cough drops  Body Aches  -Acetaminophen (Tyelnol) -Ibuprofen (Advil, motrin, aleve)  Headache  -Acetaminophen (Tyelnol) -Ibuprofen (Advil, motrin, aleve) - Exedrin, Exedrin Migraine  Allergy symptoms (cough, sneeze, runny nose, itchy eyes) -Claritin or loratadine  cheapest but likely the weakest  -Zyrtec or certizine at night because it can make you sleepy -The strongest is allegra or fexafinadine  Cheapest at [employer], [employer], [employer]  Cough  -Dextromethorphan (Delsym)- medicine that has DM in it -Guafenesin (Mucinex/Robitussin) - cough drops - drink lots of water  Chest Congestion  -Guafenesin (Mucinex/Robitussin)  Red Itchy Eyes  - Naphcon-A  Upset Stomach  - Bland diet (nothing spicy, greasy, fried, and high acid foods like tomatoes, oranges, berries) -OKAY- cereal, bread, soup, crackers, rice -Eat smaller more frequent meals -reduce caffeine, no alcohol -Loperamide (Imodium-AD) if diarrhea -Prevacid for heart burn  General health when sick  -Hydration -wash your hands frequently -keep surfaces clean -change pillow cases and sheets often -Get fresh air but do not exercise strenuously -Vitamin D, double up on it - Vitamin C -Zinc        Abdominal Bloating When you have abdominal bloating, your abdomen may feel full, tight, or painful. It may also look bigger than normal or swollen (distended). Common causes of abdominal bloating include:  Swallowing air.  Constipation.  Problems digesting food.  Eating too much.  Irritable bowel syndrome. This is a condition that affects the large intestine.  Lactose intolerance. This is an inability to digest lactose, a natural sugar in dairy products.  Celiac disease. This is a condition that affects the ability to digest gluten, a protein found in some grains.  Gastroparesis. This is a condition that slows down the movement of food in the stomach and small intestine. It is more common in people with diabetes mellitus.  Gastroesophageal reflux disease (GERD). This is a digestive condition that makes stomach acid  flow back into the esophagus.  Urinary retention. This means that the body is holding onto urine, and the bladder cannot be emptied all the way.  Follow these  instructions at home: Eating and drinking  Avoid eating too much.  Try not to swallow air while talking or eating.  Avoid eating while lying down.  Avoid these foods and drinks: ? Foods that cause gas, such as broccoli, cabbage, cauliflower, and baked beans. ? Carbonated drinks. ? Hard candy. ? Chewing gum. Medicines  Take over-the-counter and prescription medicines only as told by your health care provider.  Take probiotic medicines. These medicines contain live bacteria or yeasts that can help digestion.  Take coated peppermint oil capsules. Activity  Try to exercise regularly. Exercise may help to relieve bloating that is caused by gas and relieve constipation. General instructions  Keep all follow-up visits as told by your health care provider. This is important. Contact a health care provider if:  You have nausea and vomiting.  You have diarrhea.  You have abdominal pain.  You have unusual weight loss or weight gain.  You have severe pain, and medicines do not help. Get help right away if:  You have severe chest pain.  You have trouble breathing.  You have shortness of breath.  You have trouble urinating.  You have darker urine than normal.  You have blood in your stools or have dark, tarry stools. Summary  Abdominal bloating means that the abdomen is swollen.  Common causes of abdominal bloating are swallowing air, constipation, and problems digesting food.  Avoid eating too much and avoid swallowing air.  Avoid foods that cause gas, carbonated drinks, hard candy, and chewing gum. This information is not intended to replace advice given to you by your health care provider. Make sure you discuss any questions you have with your health care provider. Document Released: 07/18/2016 Document Revised: 07/18/2016 Document Reviewed: 07/18/2016 Elsevier Interactive Patient Education  [employer]2018 [employer]

## 2018-01-22 DIAGNOSIS — J301 Allergic rhinitis due to pollen: Secondary | ICD-10-CM | POA: Diagnosis not present

## 2018-01-22 DIAGNOSIS — J4540 Moderate persistent asthma, uncomplicated: Secondary | ICD-10-CM | POA: Diagnosis not present

## 2018-01-22 DIAGNOSIS — J3081 Allergic rhinitis due to animal (cat) (dog) hair and dander: Secondary | ICD-10-CM | POA: Diagnosis not present

## 2018-01-22 DIAGNOSIS — J3089 Other allergic rhinitis: Secondary | ICD-10-CM | POA: Diagnosis not present

## 2018-02-02 ENCOUNTER — Encounter: Payer: Self-pay | Admitting: Adult Health

## 2018-02-02 ENCOUNTER — Other Ambulatory Visit: Payer: Self-pay | Admitting: Adult Health

## 2018-02-02 MED ORDER — DICYCLOMINE HCL 20 MG PO TABS: 20.0000 mg | ORAL_TABLET | Freq: Four times a day (QID) | ORAL | 0 refills | Status: DC | PRN

## 2018-04-05 NOTE — Progress Notes (Deleted)
Complete Physical  Assessment and Plan:  Discussed med's effects and SE's. Screening labs and tests as requested with regular follow-up as recommended. Over 40 minutes of exam, counseling, chart review, and complex, high level critical decision making was performed this visit.   HPI  27 y.o. female  presents for a complete physical and follow up for has Asthma; Generalized anxiety disorder; Hypotension; Menorrhagia; and Multiple food allergies on their problem list..  Her blood pressure {HAS HAS NOT:18834} been controlled at home, today their BP is   She {DOES_DOES WUJ:81191} workout. She denies chest pain, shortness of breath, dizziness.  She reports hx of smoking since she was 13,? 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 She reports she tapered off of these medications several years ago with improved mood. She does endorse mild anxiety at this time, but feels she is managing well by lifestyle and would like to postpone daily medications.   She also reports hx of severe food allergies - apples, strawberries, raspberries, etc which have caused angioedema which was treated at an ED.   She endorses on going menorrhagia, reports she "soaks through" a maxi tampon in 1 hour. Not currently on contraception as she desires pregnancy with partner.    Current Medications:  Current Outpatient Medications on File Prior to Visit  Medication Sig Dispense Refill  . albuterol (PROVENTIL HFA;VENTOLIN HFA) 108 (90 Base) MCG/ACT inhaler Inhale 2 puffs into the lungs every 6 (six) hours as needed for wheezing or shortness of breath. 1 Inhaler 0  . azithromycin (ZITHROMAX) 250 MG tablet Take as instructed on the box for 5 days total. (Patient not taking: Reported on 11/09/2017) 6 each 0  . dicyclomine (BENTYL) 20 MG tablet Take 1 tablet (20 mg total) by mouth 4 (four) times daily  as needed for spasms. For cramping stomach pain. 60 tablet 0  . ipratropium (ATROVENT) 0.02 % nebulizer solution Take 2.5 mLs (0.5 mg total) by nebulization every 6 (six) hours as needed for wheezing or shortness of breath. 75 mL 0  . predniSONE (DELTASONE) 20 MG tablet 2 tablets daily for 3 days, 1 tablet daily for 4 days. 10 tablet 0   No current facility-administered medications on file prior to visit.    Allergies:  Allergies  Allergen Reactions  . Apple Hives  . Prunus Persica Hives  . Raspberry Hives and Swelling  . Strawberry Extract Hives  . Peanut Oil Itching and Other (See Comments)    Swelling of lips    Medical History:  She has Asthma; Generalized anxiety disorder; Hypotension; Menorrhagia; and Multiple food allergies on their problem list. Health Maintenance:   Immunization History  Administered Date(s) Administered  . Influenza,inj,Quad PF,6+ Mos 05/26/2015  . Pneumococcal Polysaccharide-23 05/26/2015    Tetanus: Pneumovax: 2016 Prevnar 13:  Flu vaccine: Zostavax: LMP: Pap: MGM:  DEXA: Colonoscopy: EGD:  Patient Care Team: Lucky Cowboy, MD as PCP - General (Internal Medicine)  Surgical History:  She has a past surgical history that includes Cesarean section (N/A, 05/24/2015). Family History:  Herfamily history includes Anxiety disorder in her mother. She was adopted. Social History:  She reports that she quit smoking about 5 months ago. Her smoking use included cigarettes. She has a 10.00 pack-year smoking history. She has never used smokeless tobacco. She reports that she does not drink alcohol or use drugs.  Review of Systems: ROS  Physical  Exam: Estimated body mass index is 20.65 kg/m as calculated from the following:   Height as of 12/15/17: 5' 5.75" (1.67 m).   Weight as of 12/15/17: 127 lb (57.6 kg). There were no vitals taken for this visit. General Appearance: Well nourished, in no apparent distress.  Eyes: PERRLA, EOMs, conjunctiva  no swelling or erythema, normal fundi and vessels.  Sinuses: No Frontal/maxillary tenderness  ENT/Mouth: Ext aud canals clear, normal light reflex with TMs without erythema, bulging. Good dentition. No erythema, swelling, or exudate on post pharynx. Tonsils not swollen or erythematous. Hearing normal.  Neck: Supple, thyroid normal. No bruits  Respiratory: Respiratory effort normal, BS equal bilaterally without rales, rhonchi, wheezing or stridor.  Cardio: RRR without murmurs, rubs or gallops. Brisk peripheral pulses without edema.  Chest: symmetric, with normal excursions and percussion.  Breasts: Symmetric, without lumps, nipple discharge, retractions.  Abdomen: Soft, nontender, no guarding, rebound, hernias, masses, or organomegaly.  Lymphatics: Non tender without lymphadenopathy.  Genitourinary:  Musculoskeletal: Full ROM all peripheral extremities,5/5 strength, and normal gait.  Skin: Warm, dry without rashes, lesions, ecchymosis. Neuro: Cranial nerves intact, reflexes equal bilaterally. Normal muscle tone, no cerebellar symptoms. Sensation intact.  Psych: Awake and oriented X 3, normal affect, Insight and Judgment appropriate.   EKG: WNL no ST changes.  Quentin Mulling 5:57 AM [facility]

## 2018-04-06 ENCOUNTER — Encounter: Payer: Self-pay | Admitting: Physician Assistant

## 2018-04-06 NOTE — Progress Notes (Signed)
Assessment and Plan:  Brianna Maddox was seen today for back pain, hip pain and circulatory problem.  Diagnoses and all orders for this visit:  SI (sacroiliac) pain/Polyarthritis of pelvis Cannot exclude spinal stenosis with achiness/weakness of bilateral lower legs; will check xrays, focused inflammatory arthritis workup, consider MRI if no explanation/not improving Refer to ortho/rheum/PT as indicated Go to the ER if you have any new weakness in your legs, have trouble controlling your urine or bowels, or have worsening pain.  -     Sedimentation rate -     C-reactive protein -     Rheumatoid factor -     Cyclic citrul peptide antibody, IgG -     HLA-B27 antigen -     DG HIPS BILAT W OR W/O PELVIS 3-4 VIEWS; Future  Other orders -     meloxicam (MOBIC) 15 MG tablet; Take one daily with food for 2 weeks, can take with tylenol, can not take with aleve, iburpofen, then as needed daily for pain  Further disposition pending results of labs. Discussed med's effects and SE's.   Over 15 minutes of exam, counseling, chart review, and critical decision making was performed.   Future Appointments  Date Time Provider [place]  04/19/2018  3:30 PM Vicie Mutters, PA-C [id number] None    ------------------------------------------------------------------------------------------------------------------   HPI BP 96/60   Pulse 87   Temp 97.7 F (36.5 C)   Ht 5' 5.75" (1.67 m)   Wt 119 lb (54 kg)   LMP 03/20/2018   SpO2 98%   BMI 19.35 kg/m   26 y.o.female , former smoker, presents for evaluation of lumbar bilateral hip/SI joint pain ongoing for several years but suddenly worse in the past 1 month or so. Denies hx of trauma. She endorses constant pain, worse with movement. Primarily in SI joints, achy/grinding in character, typically "a 7" and improves to 5/10 after taking aleve/ibuprfen. She reports new achy sensation through bilateral lower legs "like a thick feeling" from knees to  toes. She reports intermittent weak sensation in bilateral lower legs. Denies notable joint stiffness, fatigue, GI symptoms, cold/blue toes.   She reports she was an avid athlete as a child, ballet and sports, has had various ongoing joint problems. Hx of frequent hip dislocations, "but this feels very different"   She does report history of mild psoriasis.   She is adopted, no known family history.   Past Medical History:  Diagnosis Date  . Abscessed tooth 2016   during pregnancy, had antibiotics  . Anxiety   . Asthma   . Headache      Allergies  Allergen Reactions  . Apple Hives  . Prunus Persica Hives  . Raspberry Hives and Swelling  . Strawberry Extract Hives  . Peanut Oil Itching and Other (See Comments)    Swelling of lips     Current Outpatient Medications on File Prior to Visit  Medication Sig  . albuterol (PROVENTIL HFA;VENTOLIN HFA) 108 (90 Base) MCG/ACT inhaler Inhale 2 puffs into the lungs every 6 (six) hours as needed for wheezing or shortness of breath.  Marland Kitchen azithromycin (ZITHROMAX) 250 MG tablet Take as instructed on the box for 5 days total.  . dicyclomine (BENTYL) 20 MG tablet Take 1 tablet (20 mg total) by mouth 4 (four) times daily as needed for spasms. For cramping stomach pain.  Marland Kitchen ipratropium (ATROVENT) 0.02 % nebulizer solution Take 2.5 mLs (0.5 mg total) by nebulization every 6 (six) hours as needed for wheezing or shortness of  breath.  . levocetirizine (XYZAL) 5 MG tablet Take 5 mg by mouth every evening.  . montelukast (SINGULAIR) 10 MG tablet Take 10 mg by mouth at bedtime.  . predniSONE (DELTASONE) 20 MG tablet 2 tablets daily for 3 days, 1 tablet daily for 4 days.   No current facility-administered medications on file prior to visit.     ROS: all negative except above.   Physical Exam:  BP 96/60   Pulse 87   Temp 97.7 F (36.5 C)   Ht 5' 5.75" (1.67 m)   Wt 119 lb (54 kg)   LMP 03/20/2018   SpO2 98%   BMI 19.35 kg/m   General  Appearance: Well nourished, in no apparent distress. Eyes: PERRLA, EOMs, conjunctiva no swelling or erythema Sinuses: No Frontal/maxillary tenderness ENT/Mouth: Ext aud canals clear, TMs without erythema, bulging. No erythema, swelling, or exudate on post pharynx.  Tonsils not swollen or erythematous. Hearing normal.  Neck: Supple, thyroid normal.  Respiratory: Respiratory effort normal, BS equal bilaterally without rales, rhonchi, wheezing or stridor.  Cardio: RRR with no MRGs. Brisk peripheral pulses without edema.  Abdomen: Soft, + BS.  Non tender, no guarding, rebound, hernias, masses. Lymphatics: Non tender without lymphadenopathy.  Musculoskeletal: Full ROM, some symmetrical weakness in lower leg strength, normal gait. She has some sacral/SI joint pain with external rotation of hips, worse on right. No palpable popping/cracking/grinding. No tenderness over SI joint. Neg straight leg raise.  Skin: Warm, dry without rashes, lesions, ecchymosis.  Neuro: Cranial nerves intact. Normal muscle tone, no cerebellar symptoms. Sensation intact.  Psych: Awake and oriented X 3, normal affect, Insight and Judgment appropriate.     Izora Ribas, NP 5:23 PM [facility]

## 2018-04-07 ENCOUNTER — Ambulatory Visit (INDEPENDENT_AMBULATORY_CARE_PROVIDER_SITE_OTHER): Payer: 59 | Admitting: Adult Health

## 2018-04-07 ENCOUNTER — Encounter: Payer: Self-pay | Admitting: Adult Health

## 2018-04-07 VITALS — BP 96/60 | HR 87 | Temp 97.7°F | Ht 65.75 in | Wt 119.0 lb

## 2018-04-07 DIAGNOSIS — M130 Polyarthritis, unspecified: Secondary | ICD-10-CM | POA: Diagnosis not present

## 2018-04-07 DIAGNOSIS — M533 Sacrococcygeal disorders, not elsewhere classified: Secondary | ICD-10-CM

## 2018-04-07 MED ORDER — MELOXICAM 15 MG PO TABS: ORAL_TABLET | ORAL | 1 refills | Status: DC

## 2018-04-07 NOTE — Patient Instructions (Addendum)
Hip Pain The hip is the joint between the upper legs and the lower pelvis. The bones, cartilage, tendons, and muscles of your hip joint support your body and allow you to move around. Hip pain can range from a minor ache to severe pain in one or both of your hips. The pain may be felt on the inside of the hip joint near the groin, or the outside near the buttocks and upper thigh. You may also have swelling or stiffness. Follow these instructions at home: Managing pain, stiffness, and swelling  If directed, apply ice to the injured area. ? Put ice in a plastic bag. ? Place a towel between your skin and the bag. ? Leave the ice on for 20 minutes, 2-3 times a day  Sleep with a pillow between your legs on your most comfortable side.  Avoid any activities that cause pain. General instructions  Take over-the-counter and prescription medicines only as told by your health care provider.  Do any exercises as told by your health care provider.  Record the following: ? How often you have hip pain. ? The location of your pain. ? What the pain feels like. ? What makes the pain worse.  Keep all follow-up visits as told by your health care provider. This is important. Contact a health care provider if:  You cannot put weight on your leg.  Your pain or swelling continues or gets worse after one week.  It gets harder to walk.  You have a fever. Get help right away if:  You fall.  You have a sudden increase in pain and swelling in your hip.  Your hip is red or swollen or very tender to touch. Summary  Hip pain can range from a minor ache to severe pain in one or both of your hips.  The pain may be felt on the inside of the hip joint near the groin, or the outside near the buttocks and upper thigh.  Avoid any activities that cause pain.  Record how often you have hip pain, the location of the pain, what makes it worse and what it feels like. This information is not intended to  replace advice given to you by your health care provider. Make sure you discuss any questions you have with your health care provider. Document Released: 12/04/2009 Document Revised: 05/19/2016 Document Reviewed: 05/19/2016 Elsevier Interactive Patient Education  [employer].   What is the TMJ? The temporomandibular (tem-PUH-ro-man-DIB-yoo-ler) joint, or the TMJ, connects the upper and lower jawbones. This joint allows the jaw to open wide and move back and forth when you chew, talk, or yawn.There are also several muscles that help this joint move. There can be muscle tightness and pain in the muscle that can cause several symptoms.  What causes TMJ pain? There are many causes of TMJ pain. Repeated chewing (for example, chewing gum) and clenching your teeth can cause pain in the joint. Some TMJ pain has no obvious cause. What can I do to ease the pain? There are many things you can do to help your pain get better. When you have pain:  Eat soft foods and stay away from chewy foods (for example, taffy) Try to use both sides of your mouth to chew Don't chew gum Massage Don't open your mouth wide (for example, during yawning or singing) Don't bite your cheeks or fingernails Lower your amount of stress and worry Applying a warm, damp washcloth to the joint may help. Over-the-counter pain medicines such  as ibuprofen (one brand: Advil) or acetaminophen (one brand: Tylenol) might also help. Do not use these medicines if you are allergic to them or if your doctor told you not to use them. How can I stop the pain from coming back? When your pain is better, you can do these exercises to make your muscles stronger and to keep the pain from coming back:  Resisted mouth opening: Place your thumb or two fingers under your chin and open your mouth slowly, pushing up lightly on your chin with your thumb. Hold for three to six seconds. Close your mouth slowly. Resisted mouth closing: Place your thumbs  under your chin and your two index fingers on the ridge between your mouth and the bottom of your chin. Push down lightly on your chin as you close your mouth. Tongue up: Slowly open and close your mouth while keeping the tongue touching the roof of the mouth. Side-to-side jaw movement: Place an object about one fourth of an inch thick (for example, two tongue depressors) between your front teeth. Slowly move your jaw from side to side. Increase the thickness of the object as the exercise becomes easier Forward jaw movement: Place an object about one fourth of an inch thick between your front teeth and move the bottom jaw forward so that the bottom teeth are in front of the top teeth. Increase the thickness of the object as the exercise becomes easier. These exercises should not be painful. If it hurts to do these exercises, stop doing them and talk to your family doctor.

## 2018-04-08 LAB — HLA-B27 ANTIGEN: HLA-B27 Antigen: NEGATIVE

## 2018-04-08 LAB — RHEUMATOID FACTOR: Rheumatoid fact SerPl-aCnc: <14 IU/mL (ref <14)

## 2018-04-08 LAB — C-REACTIVE PROTEIN: CRP: 0.3 mg/L (ref <8.0)

## 2018-04-08 LAB — SEDIMENTATION RATE: Sed Rate: 2 mm/h (ref 0–20)

## 2018-04-08 LAB — CYCLIC CITRUL PEPTIDE ANTIBODY, IGG

## 2018-04-09 ENCOUNTER — Ambulatory Visit (HOSPITAL_COMMUNITY)
Admission: RE | Admit: 2018-04-09 | Discharge: 2018-04-09 | Disposition: A | Discharge status: Home / Self Care | Payer: 59 | Source: Ambulatory Visit | Attending: Adult Health | Admitting: Adult Health

## 2018-04-09 DIAGNOSIS — M130 Polyarthritis, unspecified: Secondary | ICD-10-CM | POA: Diagnosis not present

## 2018-04-09 DIAGNOSIS — M25551 Pain in right hip: Secondary | ICD-10-CM | POA: Diagnosis not present

## 2018-04-09 DIAGNOSIS — M533 Sacrococcygeal disorders, not elsewhere classified: Secondary | ICD-10-CM | POA: Diagnosis not present

## 2018-04-09 DIAGNOSIS — M25552 Pain in left hip: Secondary | ICD-10-CM | POA: Insufficient documentation

## 2018-04-09 DIAGNOSIS — M545 Low back pain: Secondary | ICD-10-CM | POA: Diagnosis not present

## 2018-04-15 NOTE — Progress Notes (Deleted)
Complete Physical  Assessment and Plan:  Discussed med's effects and SE's. Screening labs and tests as requested with regular follow-up as recommended. Over 40 minutes of exam, counseling, chart review, and complex, high level critical decision making was performed this visit.   HPI  27 y.o. female  presents for a complete physical and follow up for has Asthma; Generalized anxiety disorder; Hypotension; Menorrhagia; and Multiple food allergies on their problem list..  Her blood pressure {HAS HAS NOT:18834} been controlled at home, today their BP is   She {DOES_DOES ZOX:09604} workout. She denies chest pain, shortness of breath, dizziness.   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 She reports she tapered off of these medications several years ago with improved mood. She does endorse mild anxiety,managing well by lifestyle and would like to postpone daily medications.   She also reports hx of severe food allergies - apples, strawberries, raspberries, etc which have caused angioedema which was treated at an ED.   She endorses on going menorrhagia, reports she "soaks through" a maxi tampon in 1 hour. Not currently on contraception as she desires pregnancy with partner.    Current Medications:  Current Outpatient Medications on File Prior to Visit  Medication Sig Dispense Refill  . albuterol (PROVENTIL HFA;VENTOLIN HFA) 108 (90 Base) MCG/ACT inhaler Inhale 2 puffs into the lungs every 6 (six) hours as needed for wheezing or shortness of breath. 1 Inhaler 0  . azithromycin (ZITHROMAX) 250 MG tablet Take as instructed on the box for 5 days total. 6 each 0  . dicyclomine (BENTYL) 20 MG tablet Take 1 tablet (20 mg total) by mouth 4 (four) times daily as needed for spasms. For cramping stomach pain. 60 tablet 0  . ipratropium (ATROVENT) 0.02 % nebulizer solution Take 2.5 mLs (0.5 mg total) by  nebulization every 6 (six) hours as needed for wheezing or shortness of breath. 75 mL 0  . levocetirizine (XYZAL) 5 MG tablet Take 5 mg by mouth every evening.    . meloxicam (MOBIC) 15 MG tablet Take one daily with food for 2 weeks, can take with tylenol, can not take with aleve, iburpofen, then as needed daily for pain 30 tablet 1  . montelukast (SINGULAIR) 10 MG tablet Take 10 mg by mouth at bedtime.    . predniSONE (DELTASONE) 20 MG tablet 2 tablets daily for 3 days, 1 tablet daily for 4 days. 10 tablet 0   No current facility-administered medications on file prior to visit.    Allergies:  Allergies  Allergen Reactions  . Apple Hives  . Prunus Persica Hives  . Raspberry Hives and Swelling  . Strawberry Extract Hives  . Peanut Oil Itching and Other (See Comments)    Swelling of lips    Medical History:  She has Asthma; Generalized anxiety disorder; Hypotension; Menorrhagia; and Multiple food allergies on their problem list.   Health Maintenance:   Immunization History  Administered Date(s) Administered  . Influenza,inj,Quad PF,6+ Mos 05/26/2015  . Pneumococcal Polysaccharide-23 05/26/2015   Tetanus: Pneumovax: 2016 Prevnar 13:  Flu vaccine: Zostavax: LMP: Pap: MGM:  DEXA: Colonoscopy: EGD:  Patient Care Team: Lucky Cowboy, MD as PCP - General (Internal Medicine)  Surgical History:  She has a past surgical history that includes Cesarean section (N/A, 05/24/2015). Family History:  Herfamily history includes Anxiety disorder in her mother. She was adopted. Social History:  She reports that she quit smoking  about 6 months ago. Her smoking use included cigarettes. She has a 10.00 pack-year smoking history. She has never used smokeless tobacco. She reports that she does not drink alcohol or use drugs.  Review of Systems: Review of Systems  Constitutional: Negative.   HENT: Negative.   Eyes: Negative.   Respiratory: Negative.   Cardiovascular: Negative.    Gastrointestinal: Negative.   Genitourinary: Negative.   Musculoskeletal: Negative.   Skin: Negative.     Physical Exam: Estimated body mass index is 19.35 kg/m as calculated from the following:   Height as of 04/07/18: 5' 5.75" (1.67 m).   Weight as of 04/07/18: 119 lb (54 kg). LMP 03/20/2018  General Appearance: Well nourished, in no apparent distress.  Eyes: PERRLA, EOMs, conjunctiva no swelling or erythema, normal fundi and vessels.  Sinuses: No Frontal/maxillary tenderness  ENT/Mouth: Ext aud canals clear, normal light reflex with TMs without erythema, bulging. Good dentition. No erythema, swelling, or exudate on post pharynx. Tonsils not swollen or erythematous. Hearing normal.  Neck: Supple, thyroid normal. No bruits  Respiratory: Respiratory effort normal, BS equal bilaterally without rales, rhonchi, wheezing or stridor.  Cardio: RRR without murmurs, rubs or gallops. Brisk peripheral pulses without edema.  Chest: symmetric, with normal excursions and percussion.  Breasts: Symmetric, without lumps, nipple discharge, retractions.  Abdomen: Soft, nontender, no guarding, rebound, hernias, masses, or organomegaly.  Lymphatics: Non tender without lymphadenopathy.  Genitourinary:  Musculoskeletal: Full ROM all peripheral extremities,5/5 strength, and normal gait.  Skin: Warm, dry without rashes, lesions, ecchymosis. Neuro: Cranial nerves intact, reflexes equal bilaterally. Normal muscle tone, no cerebellar symptoms. Sensation intact.  Psych: Awake and oriented X 3, normal affect, Insight and Judgment appropriate.   EKG: WNL no ST changes.  Quentin Mulling 8:35 AM [facility]

## 2018-04-19 ENCOUNTER — Ambulatory Visit (INDEPENDENT_AMBULATORY_CARE_PROVIDER_SITE_OTHER): Payer: 59 | Admitting: Adult Health

## 2018-04-19 ENCOUNTER — Encounter: Payer: Self-pay | Admitting: Physician Assistant

## 2018-04-19 ENCOUNTER — Encounter: Payer: Self-pay | Admitting: Adult Health

## 2018-04-19 VITALS — BP 90/60 | HR 69 | Temp 97.5°F | Wt 122.0 lb

## 2018-04-19 DIAGNOSIS — F321 Major depressive disorder, single episode, moderate: Secondary | ICD-10-CM | POA: Diagnosis not present

## 2018-04-19 DIAGNOSIS — F1310 Sedative, hypnotic or anxiolytic abuse, uncomplicated: Secondary | ICD-10-CM | POA: Diagnosis not present

## 2018-04-19 HISTORY — DX: Sedative, hypnotic or anxiolytic abuse, uncomplicated: F13.10

## 2018-04-19 MED ORDER — ESCITALOPRAM OXALATE 10 MG PO TABS: 10.0000 mg | ORAL_TABLET | Freq: Every day | ORAL | 2 refills | Status: DC

## 2018-04-19 NOTE — Progress Notes (Signed)
Assessment and Plan:  Brianna Maddox was seen today for medication management and referral.  Diagnoses and all orders for this visit:   Current moderate episode of major depressive disorder without prior episode (HCC) Initiate lexapro 10 mg daily; discussed cognitive behavioral therapy which she is motivated to do List of resources provided, recommended she call her insurance for covered provider  Lifestyle discussed: diet/exerise, sleep hygiene, stress management, hydration, suggested omega 3 and vitamin D supplements Patient feels optimistic regarding plan discussed today Follow up in 6-8 weeks -     escitalopram (LEXAPRO) 10 MG tablet; Take 1 tablet (10 mg total) by mouth daily.  Further disposition pending results of labs. Discussed med's effects and SE's.   Over 30 minutes of exam, counseling, chart review, and critical decision making was performed.   Future Appointments  Date Time Provider Department Center  04/27/2019  3:00 PM Quentin Mulling, PA-C [facility] None   ------------------------------------------------------------------------------------------------------------------  HPI BP 90/60   Pulse 69   Temp (!) 97.5 F (36.4 C)   Wt 122 lb (55.3 kg)   LMP 04/16/2018   SpO2 98%   BMI 19.84 kg/m   26 y.o.female with hx of presents for evaluation of mood; she reports she has been feeling "out of control" - she reports this is ongoing for the past year, but seems to be worse in the last 1-2 months. She feels angry and lashes out, unhappy with her job, has toddler at home and keeping her mom on the weekends (had stroke with significant deficits). She does lives with her fiance/partner of 10 years and that relationship is stable/good. She is not pregnant, currently on menstrual cycle.   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 She reports she tapered off of these medications several  years ago with improved mood, and reported previously that she wanted to try managing by lifestyle. She denies current drug/substance abuse, not currently drinking ETOH.   She does report increased fatigue, but sleeps with toddler in bed and has been particularly restless.   She reports her joint pain that she was seen for recently is significantly improved/well managed on mobic.    Past Medical History:  Diagnosis Date  . Abscessed tooth 2016   during pregnancy, had antibiotics  . Anxiety   . Asthma   . Headache      Allergies  Allergen Reactions  . Apple Hives  . Prunus Persica Hives  . Raspberry Hives and Swelling  . Strawberry Extract Hives  . Peanut Oil Itching and Other (See Comments)    Swelling of lips     Current Outpatient Medications on File Prior to Visit  Medication Sig  . albuterol (PROVENTIL HFA;VENTOLIN HFA) 108 (90 Base) MCG/ACT inhaler Inhale 2 puffs into the lungs every 6 (six) hours as needed for wheezing or shortness of breath.  . dicyclomine (BENTYL) 20 MG tablet Take 1 tablet (20 mg total) by mouth 4 (four) times daily as needed for spasms. For cramping stomach pain.  Marland Kitchen ipratropium (ATROVENT) 0.02 % nebulizer solution Take 2.5 mLs (0.5 mg total) by nebulization every 6 (six) hours as needed for wheezing or shortness of breath.  . levocetirizine (XYZAL) 5 MG tablet Take 5 mg by mouth every evening.  . meloxicam (MOBIC) 15 MG tablet Take one daily with food for 2 weeks, can take with tylenol, can not take with aleve, iburpofen, then as needed daily for pain  .  montelukast (SINGULAIR) 10 MG tablet Take 10 mg by mouth at bedtime.  Marland Kitchen azithromycin (ZITHROMAX) 250 MG tablet Take as instructed on the box for 5 days total. (Patient not taking: Reported on 04/19/2018)  . predniSONE (DELTASONE) 20 MG tablet 2 tablets daily for 3 days, 1 tablet daily for 4 days. (Patient not taking: Reported on 04/19/2018)   No current facility-administered medications on file prior  to visit.     ROS: all negative except above.   Physical Exam:  BP 90/60   Pulse 69   Temp (!) 97.5 F (36.4 C)   Wt 122 lb (55.3 kg)   LMP 04/16/2018   SpO2 98%   BMI 19.84 kg/m   General Appearance: Well nourished, in no apparent distress. Eyes: PERRLA, conjunctiva no swelling or erythema ENT/Mouth: No erythema, swelling, or exudate on post pharynx.  Hearing normal.  Neck: Supple, thyroid normal.  Respiratory: Respiratory effort normal, BS equal bilaterally without rales, rhonchi, wheezing or stridor.  Cardio: RRR with no MRGs. Brisk peripheral pulses without edema.  Abdomen: Soft, + BS.  Non tender, no guarding, rebound, hernias, masses. Lymphatics: Non tender without lymphadenopathy.  Musculoskeletal:  normal gait.  Skin: Warm, dry without rashes, lesions, ecchymosis.  Neuro: Cranial nerves intact. Normal muscle tone, no cerebellar symptoms. Sensation intact.  Psych: Awake and oriented X 3, depressed affect, Insight and Judgment appropriate.    PHQ-9: 13 Dan Maker, NP 4:54 PM [facility]

## 2018-04-19 NOTE — Patient Instructions (Addendum)
Counseling services  Here are some numbers below you can try but I suggest calling your insurance and finding out who is in your network and THEN calling those people or looking them up on google.   I'm a big fan of Cognitive Behavioral Therapy, look this up on You tube or check with the therapist you see if they are certified.  This form of therapy helps to teach you skills to better handle with current situation that are causing anxiety or depression.   Dr. Vilinda Flake, Ph.D. [address] [place] [address] Phone: [phone]     [facility] Adolphus Birchwood, NP [phone] [address]  Suite [phone] Fax [phone]  [facility] Hours: Monday-Thursday 830-8pm  Friday 830AM-7PM Address: [address] [phone]  [facility].  Address: [address] [place], [place] [address] Phone-[phone]  [facility] [phone] office [email or web address] [address] [place], [place] [address]  Sherie Don, M.Ed [phone] [address], [place] [address]   Franchot Erichsen, MA, clinical psychologist  Cognitive-Behavior Therapy; Mood Disorders; Anxiety Disorders; adult and child ADHD; Family Therapy; Stress Management; personal growth, and Marital Therapy.    Carlus Pavlov Ph.D., clinical psychologist Cognitive-Behavior Therapy; Mood Disorders; Anxiety Disorders; Stress     Management  [facility] [address], [place], [place] [address] [phone]   [facility] Sheran Luz, psychotherapist [address] [place], [place] [address] [phone]  Miguel Aschoff Ph.D., clinical psychologist [phone] office [address] [place], [place] [address] Cognitive Behavior Therapy, Depression, Bipolar, Anxiety, Grief and Loss    Escitalopram tablets What is this medicine? ESCITALOPRAM  (es sye TAL oh pram) is used to treat depression and certain types of anxiety. This medicine may be used for other purposes; ask your health care provider or pharmacist if you have questions. COMMON BRAND NAME(S): Lexapro What should I tell my health care provider before I take this medicine? They need to know if you have any of these conditions: -bipolar disorder or a family history of bipolar disorder -diabetes -glaucoma -heart disease -kidney or liver disease -receiving electroconvulsive therapy -seizures (convulsions) -suicidal thoughts, plans, or attempt by you or a family member -an unusual or allergic reaction to escitalopram, the related drug citalopram, other medicines, foods, dyes, or preservatives -pregnant or trying to become pregnant -breast-feeding How should I use this medicine? Take this medicine by mouth with a glass of water. Follow the directions on the prescription label. You can take it with or without food. If it upsets your stomach, take it with food. Take your medicine at regular intervals. Do not take it more often than directed. Do not stop taking this medicine suddenly except upon the advice of your doctor. Stopping this medicine too quickly may cause serious side effects or your condition may worsen. A special MedGuide will be given to you by the pharmacist with each prescription and refill. Be sure to read this information carefully each time. Talk to your pediatrician regarding the use of this medicine in children. Special care may be needed. Overdosage: If you think you have taken too much of this medicine contact a poison control center or emergency room at once. NOTE: This medicine is only for you. Do not share this medicine with others. What if I miss a dose? If you miss a dose, take it as soon as you can. If it is almost  time for your next dose, take only that dose. Do not take double or extra doses. What may interact with this medicine? Do not take this  medicine with any of the following medications: -certain medicines for fungal infections like fluconazole, itraconazole, ketoconazole, posaconazole, voriconazole -cisapride -citalopram -dofetilide -dronedarone -linezolid -MAOIs like Carbex, Eldepryl, Marplan, Nardil, and Parnate -methylene blue (injected into a vein) -pimozide -thioridazine -ziprasidone This medicine may also interact with the following medications: -alcohol -amphetamines -aspirin and aspirin-like medicines -carbamazepine -certain medicines for depression, anxiety, or psychotic disturbances -certain medicines for migraine headache like almotriptan, eletriptan, frovatriptan, naratriptan, rizatriptan, sumatriptan, zolmitriptan -certain medicines for sleep -certain medicines that treat or prevent blood clots like warfarin, enoxaparin, dalteparin -cimetidine -diuretics -fentanyl -furazolidone -isoniazid -lithium -metoprolol -NSAIDs, medicines for pain and inflammation, like ibuprofen or naproxen -other medicines that prolong the QT interval (cause an abnormal heart rhythm) -procarbazine -rasagiline -supplements like St. John's wort, kava kava, valerian -tramadol -tryptophan This list may not describe all possible interactions. Give your health care provider a list of all the medicines, herbs, non-prescription drugs, or dietary supplements you use. Also tell them if you smoke, drink alcohol, or use illegal drugs. Some items may interact with your medicine. What should I watch for while using this medicine? Tell your doctor if your symptoms do not get better or if they get worse. Visit your doctor or health care professional for regular checks on your progress. Because it may take several weeks to see the full effects of this medicine, it is important to continue your treatment as prescribed by your doctor. Patients and their families should watch out for new or worsening thoughts of suicide or depression. Also watch  out for sudden changes in feelings such as feeling anxious, agitated, panicky, irritable, hostile, aggressive, impulsive, severely restless, overly excited and hyperactive, or not being able to sleep. If this happens, especially at the beginning of treatment or after a change in dose, call your health care professional. Bonita Quin may get drowsy or dizzy. Do not drive, use machinery, or do anything that needs mental alertness until you know how this medicine affects you. Do not stand or sit up quickly, especially if you are an older patient. This reduces the risk of dizzy or fainting spells. Alcohol may interfere with the effect of this medicine. Avoid alcoholic drinks. Your mouth may get dry. Chewing sugarless gum or sucking hard candy, and drinking plenty of water may help. Contact your doctor if the problem does not go away or is severe. What side effects may I notice from receiving this medicine? Side effects that you should report to your doctor or health care professional as soon as possible: -allergic reactions like skin rash, itching or hives, swelling of the face, lips, or tongue -anxious -black, tarry stools -changes in vision -confusion -elevated mood, decreased need for sleep, racing thoughts, impulsive behavior -eye pain -fast, irregular heartbeat -feeling faint or lightheaded, falls -feeling agitated, angry, or irritable -hallucination, loss of contact with reality -loss of balance or coordination -loss of memory -painful or prolonged erections -restlessness, pacing, inability to keep still -seizures -stiff muscles -suicidal thoughts or other mood changes -trouble sleeping -unusual bleeding or bruising -unusually weak or tired -vomiting Side effects that usually do not require medical attention (report to your doctor or health care professional if they continue or are bothersome): -changes in appetite -change in sex drive or performance -headache -increased  sweating -indigestion, nausea -tremors This list may not describe all possible side effects. Call your doctor  for medical advice about side effects. You may report side effects to FDA at 1-800-FDA-1088. Where should I keep my medicine? Keep out of reach of children. Store at room temperature between 15 and 30 degrees C (59 and 86 degrees F). Throw away any unused medicine after the expiration date. NOTE: This sheet is a summary. It may not cover all possible information. If you have questions about this medicine, talk to your doctor, pharmacist, or health care provider.  2018 Elsevier/Gold Standard (2015-11-19 13:20:23)   Know what a healthy weight is for you (roughly BMI <25) and aim to maintain this  Aim for 7+ servings of fruits and vegetables daily  65-80+ fluid ounces of water or unsweet tea for healthy kidneys  Limit to max 1 drink of alcohol per day; avoid smoking/tobacco  Limit animal fats in diet for cholesterol and heart health - choose grass fed whenever available  Avoid highly processed foods, and foods high in saturated/trans fats  Aim for low stress - take time to unwind and care for your mental health  Aim for 150 min of moderate intensity exercise weekly for heart health, and weights twice weekly for bone health  Aim for 7-9 hours of sleep daily

## 2018-07-13 ENCOUNTER — Other Ambulatory Visit: Payer: Self-pay | Admitting: Adult Health

## 2018-07-13 DIAGNOSIS — F321 Major depressive disorder, single episode, moderate: Secondary | ICD-10-CM

## 2018-08-19 ENCOUNTER — Other Ambulatory Visit: Payer: Self-pay | Admitting: Internal Medicine

## 2018-08-19 MED ORDER — PREDNISONE 20 MG PO TABS: ORAL_TABLET | ORAL | 0 refills | Status: DC

## 2018-08-19 MED ORDER — AZITHROMYCIN 250 MG PO TABS: ORAL_TABLET | ORAL | 1 refills | Status: DC

## 2019-01-13 ENCOUNTER — Other Ambulatory Visit: Payer: Self-pay | Admitting: Adult Health

## 2019-01-13 DIAGNOSIS — J45909 Unspecified asthma, uncomplicated: Secondary | ICD-10-CM

## 2019-01-13 DIAGNOSIS — F17200 Nicotine dependence, unspecified, uncomplicated: Secondary | ICD-10-CM

## 2019-01-18 DIAGNOSIS — F17200 Nicotine dependence, unspecified, uncomplicated: Secondary | ICD-10-CM | POA: Insufficient documentation

## 2019-01-18 NOTE — Progress Notes (Signed)
Complete Physical  Assessment and Plan:  Brianna Maddox was seen today for annual exam.  Diagnoses and all orders for this visit:  Encounter for routine adult health examination with abnormal findings  Asthma, unspecified asthma severity, unspecified whether complicated, unspecified whether persistent Continue meds, advised to quit smoking Has been referred to pulm, pending PFTs as last remote and having some dyspnea, ? Also with mild COPD  Multiple food allergies Avoid triggers  Menorrhagia with regular cycle F/u GYN Stress management techniques discussed, increase water, good sleep hygiene discussed, increase exercise, and increase veggies.   Current moderate episode of major depressive disorder without prior episode ([place]) Significant stress at home; trial increase of lexapro She will be starting counseling  Stress management techniques discussed, increase water, good sleep hygiene discussed, increase exercise, and increase veggies.  -     escitalopram (LEXAPRO) 20 MG tablet; Take 1 tablet (20 mg total) by mouth daily.  Smoker Discussed risks associated with tobacco use and advised to reduce or quit Patient is ready to do so and plans to taper  Declines Chantix or other medication Will follow up at the next visit  Fatigue, unspecified type -     CBC with Differential/Platelet -     TSH -     Vitamin B12 -     Iron,Total/Total Iron Binding Cap  Medication management -     CBC with Differential/Platelet -     COMPLETE METABOLIC PANEL WITH GFR -     Magnesium -     Urinalysis, Routine w reflex microscopic  Anemia, unspecified type -     CBC with Differential/Platelet -     Vitamin B12 -     Iron,Total/Total Iron Binding Cap  Abnormal glucose -     Hemoglobin A1c  Screening for diabetes mellitus -     Hemoglobin A1c  Vitamin D deficiency -     VITAMIN D 25 Hydroxy (Vit-D Deficiency, Fractures)  Paresthesias Unclear etiology; check labs, TSH, CBC, B12, will work on  lifestyle, stress management Consider MRI vs neurology referral if persistent at follow up  Discussed med's effects and SE's. Screening labs and tests as requested with regular follow-up as recommended. Over 40 minutes of exam, counseling, chart review, and complex, high level critical decision making was performed this visit.   Future Appointments  Date Time Provider [place]  07/25/2019  4:30 PM Liane Comber, NP [facility] None  01/23/2020  3:00 PM Liane Comber, NP [facility] None     HPI  28 y.o. female  presents for a complete physical and follow up for has Asthma; Menorrhagia; Multiple food allergies; Current moderate episode of major depressive disorder without prior episode ([place]); and Smoker on their problem list.  She has significant other, 1 child 54 y/o boy. 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   She admits she has restarted smoking; has been smoking a pack per day for the last several weeks, has 10 year history of cigarette.   She is on xyxal for environmental allergies  She has hx of asthma and takes singulair 10 mg daily, PRN albuterol; she has been referred to pulmonology for PFTs as last remote per patient preference, pending evaluation by Dr. Teressa Lower.   she has a diagnosis of depression and is currently on lexapro 10 mg, reports symptoms are well controlled on current regimen. She has increased stress r/t covid, snapping at SO and family, has reached out to a therapist to start counseling.  She reports for the past year when doing yoga she has intermittent numbness/tingling of upper and lower extremities. Denies notable weakness. Does awaken at night sometimes with numb arms. Does have long history of neck and back pain though this is recently improved.   BMI is Body mass index is 20.01 kg/m., she has been working on diet, admits to minimal exercise.  Wt Readings from Last 3 Encounters:  01/19/19 124 lb (56.2 kg)  04/19/18  122 lb (55.3 kg)  04/07/18 119 lb (54 kg)   Today their BP is BP: 90/60 She does not workout. She denies chest pain, shortness of breath, dizziness.   Last GFR: Lab Results  Component Value Date   GFRNONAA >60 10/29/2017    Patient reports hx of vitamin D deficiency and has been on high dose supplements in the past No results found for: VD25OH    Lab Results  Component Value Date   WBC 8.3 11/09/2017   HGB 12.8 11/09/2017   HCT 37.1 11/09/2017   MCV 90.7 11/09/2017   PLT 345 11/09/2017   Lab Results  Component Value Date   IRON 85 11/09/2017   TIBC 384 11/09/2017   Lab Results  Component Value Date   VITAMINB12 904 11/09/2017     Current Medications:  Current Outpatient Medications on File Prior to Visit  Medication Sig Dispense Refill  . albuterol (PROVENTIL HFA;VENTOLIN HFA) 108 (90 Base) MCG/ACT inhaler Inhale 2 puffs into the lungs every 6 (six) hours as needed for wheezing or shortness of breath. 1 Inhaler 0  . ipratropium (ATROVENT) 0.02 % nebulizer solution Take 2.5 mLs (0.5 mg total) by nebulization every 6 (six) hours as needed for wheezing or shortness of breath. 75 mL 0  . levocetirizine (XYZAL) 5 MG tablet Take 5 mg by mouth every evening.    . meloxicam (MOBIC) 15 MG tablet Take one daily with food for 2 weeks, can take with tylenol, can not take with aleve, iburpofen, then as needed daily for pain 30 tablet 1  . montelukast (SINGULAIR) 10 MG tablet Take 10 mg by mouth at bedtime.     No current facility-administered medications on file prior to visit.    Allergies:  Allergies  Allergen Reactions  . Apple Hives  . Prunus Persica Hives  . Raspberry Hives and Swelling  . Strawberry Extract Hives  . Peanut Oil Itching and Other (See Comments)    Swelling of lips    Medical History:  She has Asthma; Menorrhagia; Multiple food allergies; Current moderate episode of major depressive disorder without prior episode (HCC); and Smoker on their problem  list. Health Maintenance:   Immunization History  Administered Date(s) Administered  . Influenza,inj,Quad PF,6+ Mos 05/26/2015  . Pneumococcal Polysaccharide-23 05/26/2015    Tetanus: 2016 Pneumovax: 2016 Flu vaccine:  HPV: ? Has had, will check with peds  LMP: Patient's last menstrual period was 01/03/2019 (exact date). Pap: 2016, negative, ? Neg HPV  MGM: never  Colonoscopy: EGD:  Last Dental Exam: Dr. Lawrence Marseillesivils, 2020, goes q4930m Last Eye Exam: Dr. Emily FilbertGould, last exam 2020, glasses  Patient Care Team: Lucky CowboyMcKeown, William, MD as PCP - General (Internal Medicine)  Surgical History:  She has a past surgical history that includes Cesarean section (N/A, 05/24/2015). Family History:  Herfamily history includes Anxiety disorder in her mother; Drug abuse in her mother; Thyroid disease in her mother. She was adopted. Social History:  She reports that she has been smoking cigarettes. She has a 12.00 pack-year smoking history. She  has never used smokeless tobacco. She reports current alcohol use. She reports previous drug use. Drug: Benzodiazepines.  Review of Systems: Review of Systems  Constitutional: Positive for malaise/fatigue. Negative for weight loss.  HENT: Negative for hearing loss and tinnitus.   Eyes: Negative for blurred vision and double vision.  Respiratory: Negative for cough, shortness of breath and wheezing.   Cardiovascular: Negative for chest pain, palpitations, orthopnea, claudication and leg swelling.  Gastrointestinal: Negative for abdominal pain, blood in stool, constipation, diarrhea, heartburn, melena, nausea and vomiting.  Genitourinary: Negative.   Musculoskeletal: Negative for back pain, joint pain, myalgias and neck pain.  Skin: Negative for rash.  Neurological: Positive for dizziness and tingling (intermittent numbness/tingling of bilateral arms/lower legs). Negative for sensory change, weakness and headaches.  Endo/Heme/Allergies: Negative for polydipsia.   Psychiatric/Behavioral: Positive for depression. Negative for memory loss, substance abuse and suicidal ideas. The patient is nervous/anxious. The patient does not have insomnia.   All other systems reviewed and are negative.   Physical Exam: Estimated body mass index is 20.01 kg/m as calculated from the following:   Height as of this encounter: 5\' 6"  (1.676 m).   Weight as of this encounter: 124 lb (56.2 kg). BP 90/60   Pulse 87   Temp (!) 97.2 F (36.2 C)   Ht 5\' 6"  (1.676 m)   Wt 124 lb (56.2 kg)   LMP 01/03/2019 (Exact Date)   SpO2 98%   Breastfeeding No   BMI 20.01 kg/m  General Appearance: Well nourished, in no apparent distress.  Eyes: PERRLA, EOMs, conjunctiva no swelling or erythema, normal fundi and vessels.  Sinuses: No Frontal/maxillary tenderness  ENT/Mouth: Ext aud canals clear, normal light reflex with TMs without erythema, bulging. Good dentition. No erythema, swelling, or exudate on post pharynx. Tonsils not swollen or erythematous. Hearing normal.  Neck: Supple, thyroid normal. No bruits  Respiratory: Respiratory effort normal, BS equal bilaterally without rales, rhonchi, wheezing or stridor.  Cardio: RRR without murmurs, rubs or gallops. Brisk peripheral pulses without edema.  Chest: symmetric, with normal excursions and percussion.  Breasts: Symmetric, without lumps, nipple discharge, retractions.   Abdomen: Soft, nontender, no guarding, rebound, hernias, masses, or organomegaly.  Lymphatics: Non tender without lymphadenopathy.  Genitourinary: defer to GYN Musculoskeletal: Full ROM all peripheral extremities,5/5 strength, and normal gait.  Skin: Warm, dry without rashes, lesions, ecchymosis. Neuro: Cranial nerves intact, reflexes equal bilaterally. Normal muscle tone, no cerebellar symptoms. Sensation intact.  Psych: Awake and oriented X 3, normal affect, Insight and Judgment appropriate.   EKG: WNL in chart from 2019; defer   Brianna MakerAshley C Cherrell Maddox 5:18  PM [facility]

## 2019-01-19 ENCOUNTER — Encounter: Payer: Self-pay | Admitting: Adult Health

## 2019-01-19 ENCOUNTER — Other Ambulatory Visit: Payer: Self-pay

## 2019-01-19 ENCOUNTER — Ambulatory Visit (INDEPENDENT_AMBULATORY_CARE_PROVIDER_SITE_OTHER): Payer: BC Managed Care – PPO | Admitting: Adult Health

## 2019-01-19 VITALS — BP 90/60 | HR 87 | Temp 97.2°F | Ht 66.0 in | Wt 124.0 lb

## 2019-01-19 DIAGNOSIS — R5383 Other fatigue: Secondary | ICD-10-CM

## 2019-01-19 DIAGNOSIS — Z1329 Encounter for screening for other suspected endocrine disorder: Secondary | ICD-10-CM

## 2019-01-19 DIAGNOSIS — Z130 Encounter for screening for diseases of the blood and blood-forming organs and certain disorders involving the immune mechanism: Secondary | ICD-10-CM | POA: Diagnosis not present

## 2019-01-19 DIAGNOSIS — J45909 Unspecified asthma, uncomplicated: Secondary | ICD-10-CM

## 2019-01-19 DIAGNOSIS — Z79899 Other long term (current) drug therapy: Secondary | ICD-10-CM

## 2019-01-19 DIAGNOSIS — Z13220 Encounter for screening for lipoid disorders: Secondary | ICD-10-CM

## 2019-01-19 DIAGNOSIS — Z0001 Encounter for general adult medical examination with abnormal findings: Secondary | ICD-10-CM

## 2019-01-19 DIAGNOSIS — Z0000 Encounter for general adult medical examination without abnormal findings: Secondary | ICD-10-CM | POA: Diagnosis not present

## 2019-01-19 DIAGNOSIS — D649 Anemia, unspecified: Secondary | ICD-10-CM

## 2019-01-19 DIAGNOSIS — Z131 Encounter for screening for diabetes mellitus: Secondary | ICD-10-CM

## 2019-01-19 DIAGNOSIS — F321 Major depressive disorder, single episode, moderate: Secondary | ICD-10-CM

## 2019-01-19 DIAGNOSIS — Z1389 Encounter for screening for other disorder: Secondary | ICD-10-CM | POA: Diagnosis not present

## 2019-01-19 DIAGNOSIS — F17200 Nicotine dependence, unspecified, uncomplicated: Secondary | ICD-10-CM

## 2019-01-19 DIAGNOSIS — R7309 Other abnormal glucose: Secondary | ICD-10-CM

## 2019-01-19 DIAGNOSIS — N920 Excessive and frequent menstruation with regular cycle: Secondary | ICD-10-CM

## 2019-01-19 DIAGNOSIS — E559 Vitamin D deficiency, unspecified: Secondary | ICD-10-CM | POA: Diagnosis not present

## 2019-01-19 DIAGNOSIS — R202 Paresthesia of skin: Secondary | ICD-10-CM

## 2019-01-19 DIAGNOSIS — Z91018 Allergy to other foods: Secondary | ICD-10-CM

## 2019-01-19 MED ORDER — ESCITALOPRAM OXALATE 20 MG PO TABS: 20.0000 mg | ORAL_TABLET | Freq: Every day | ORAL | 1 refills | Status: DC

## 2019-01-19 NOTE — Patient Instructions (Addendum)
[name] , Thank you for taking time to come for your Annual Wellness Visit. I appreciate your ongoing commitment to your health goals. Please review the following plan we discussed and let me know if I can assist you in the future.   These are the goals we discussed: Goals    . DIET - INCREASE WATER INTAKE     65+ fluid ounces daily    . Exercise 150 min/wk Moderate Activity     Start slow- 5 min at a time and gradually increase    . Quit Smoking       This is a list of the screening recommended for you and due dates:  Health Maintenance  Topic Date Due  . PAP-Cervical Cytology Screening  06/24/2012  . Pap Smear  06/24/2012  . Flu Shot  01/29/2019  . Tetanus Vaccine  03/07/2025  . HIV Screening  Completed   Get on 5000 units of vitamin D   Vitamin C 500 mg twice daily   Zinc 30-40 mg   Know what a healthy weight is for you (roughly BMI <25) and aim to maintain this  Aim for 7+ servings of fruits and vegetables daily  65-80+ fluid ounces of water or unsweet tea for healthy kidneys  Limit to max 1 drink of alcohol per day; avoid smoking/tobacco  Limit animal fats in diet for cholesterol and heart health - choose grass fed whenever available  Avoid highly processed foods, and foods high in saturated/trans fats  Aim for low stress - take time to unwind and care for your mental health  Aim for 150 min of moderate intensity exercise weekly for heart health, and weights twice weekly for bone health  Aim for 7-9 hours of sleep daily      SMOKING CESSATION  American cancer society  [phone] for more information or for a free program for smoking cessation help.   You can call QUIT SMART 1-800-QUIT-NOW for free nicotine patches or replacement therapy- if they are out- keep calling  [facility] Can call for smoking cessation classes, [phone]  If you have a smart phone, please look up Smoke Free app, this will help you stay on track and  give you information about money you have saved, life that you have gained back and a ton of more information.     ADVANTAGES OF QUITTING SMOKING  Within 20 minutes, blood pressure decreases. Your pulse is at normal level.  After 8 hours, carbon monoxide levels in the blood return to normal. Your oxygen level increases.  After 24 hours, the chance of having a heart attack starts to decrease. Your breath, hair, and body stop smelling like smoke.  After 48 hours, damaged nerve endings begin to recover. Your sense of taste and smell improve.  After 72 hours, the body is virtually free of nicotine. Your bronchial tubes relax and breathing becomes easier.  After 2 to 12 weeks, lungs can hold more air. Exercise becomes easier and circulation improves.  After 1 year, the risk of coronary heart disease is cut in half.  After 5 years, the risk of stroke falls to the same as a nonsmoker.  After 10 years, the risk of lung cancer is cut in half and the risk of other cancers decreases significantly.  After 15 years, the risk of coronary heart disease drops, usually to the level of a nonsmoker.  You will have extra money to spend on things other than cigarettes.    Paresthesia  Paresthesia is an abnormal burning or prickling sensation. It is usually felt in the hands, arms, legs, or feet. However, it may occur in any part of the body. Usually, paresthesia is not painful. It may feel like:  Tingling or numbness.  Buzzing.  Itching. Paresthesia may occur without any clear cause, or it may be caused by:  Breathing too quickly (hyperventilation).  Pressure on a nerve.  An underlying medical condition.  Side effects of a medication.  Nutritional deficiencies.  Exposure to toxic chemicals. Most people experience temporary (transient) paresthesia at some time in their lives. For some people, it may be long-lasting (chronic) because of an underlying medical condition. If you have  paresthesia that lasts a long time, you may need to be evaluated by your health care provider. Follow these instructions at home: Alcohol use   Do not drink alcohol if: ? Your health care provider tells you not to drink. ? You are pregnant, may be pregnant, or are planning to become pregnant.  If you drink alcohol: ? Limit how much you use to:  0-1 drink a day for women.  0-2 drinks a day for men. ? Be aware of how much alcohol is in your drink. In the [place] one drink equals one 12 oz bottle of beer (355 mL), one 5 oz glass of wine (148 mL), or one 1 oz glass of hard liquor (44 mL). Nutrition   Eat a healthy diet. This includes: ? Eating foods that are high in fiber, such as fresh fruits and vegetables, whole grains, and beans. ? Limiting foods that are high in fat and processed sugars, such as fried or sweet foods. General instructions  Take over-the-counter and prescription medicines only as told by your health care provider.  Do not use any products that contain nicotine or tobacco, such as cigarettes and e-cigarettes. These can keep blood from reaching damaged nerves. If you need help quitting, ask your health care provider.  If you have diabetes, work closely with your health care provider to keep your blood sugar under control.  If you have numbness in your feet: ? Check every day for signs of injury or infection. Watch for redness, warmth, and swelling. ? Wear padded socks and comfortable shoes. These help protect your feet.  Keep all follow-up visits as told by your health care provider. This is important. Contact a health care provider if you:  Have paresthesia that gets worse or does not go away.  Have a burning or prickling feeling that gets worse when you walk.  Have pain, cramps, or dizziness.  Develop a rash. Get help right away if you:  Feel weak.  Have trouble walking or moving.  Have problems with speech, understanding, or vision.  Feel  confused.  Cannot control your bladder or bowel movements.  Have numbness after an injury.  Develop new weakness in an arm or leg.  Faint. Summary  Paresthesia is an abnormal burning or prickling sensation that is usually felt in the hands, arms, legs, or feet. It may also occur in other parts of the body.  Paresthesia may occur without any clear cause, or it may be caused by breathing too quickly (hyperventilation), pressure on a nerve, an underlying medical condition, side effects of a medication, nutritional deficiencies, or exposure to toxic chemicals.  If you have paresthesia that lasts a long time, you may need to be evaluated by your health care provider. This information is not intended to replace advice given to you by  your health care provider. Make sure you discuss any questions you have with your health care provider. Document Released: 06/06/2002 Document Revised: 07/12/2018 Document Reviewed: 06/25/2017 Elsevier Patient Education  2020 [employer]

## 2019-01-20 LAB — CBC WITH DIFFERENTIAL/PLATELET
Absolute Monocytes: 525 cells/uL (ref 200–950)
Basophils Absolute: 57 cells/uL (ref 0–200)
Basophils Relative: 0.8 %
Eosinophils Absolute: 149 cells/uL (ref 15–500)
Eosinophils Relative: 2.1 %
HCT: 37 % (ref 35.0–45.0)
Hemoglobin: 12.3 g/dL (ref 11.7–15.5)
Lymphs Abs: 2130 cells/uL (ref 850–3900)
MCH: 31.5 pg (ref 27.0–33.0)
MCHC: 33.2 g/dL (ref 32.0–36.0)
MCV: 94.9 fL (ref 80.0–100.0)
MPV: 12.6 fL — ABNORMAL HIGH (ref 7.5–12.5)
Monocytes Relative: 7.4 %
Neutro Abs: 4239 cells/uL (ref 1500–7800)
Neutrophils Relative %: 59.7 %
Platelets: 196 10*3/uL (ref 140–400)
RBC: 3.9 10*6/uL (ref 3.80–5.10)
RDW: 12.5 % (ref 11.0–15.0)
Total Lymphocyte: 30 %
WBC: 7.1 10*3/uL (ref 3.8–10.8)

## 2019-01-20 LAB — URINALYSIS, ROUTINE W REFLEX MICROSCOPIC
Bilirubin Urine: NEGATIVE
Glucose, UA: NEGATIVE
Hgb urine dipstick: NEGATIVE
Ketones, ur: NEGATIVE
Leukocytes,Ua: NEGATIVE
Nitrite: NEGATIVE
Protein, ur: NEGATIVE
Specific Gravity, Urine: 1.015 (ref 1.001–1.03)
pH: 6 (ref 5.0–8.0)

## 2019-01-20 LAB — COMPLETE METABOLIC PANEL WITH GFR
AG Ratio: 2.1 (calc) (ref 1.0–2.5)
ALT: 9 U/L (ref 6–29)
AST: 12 U/L (ref 10–30)
Albumin: 4.4 g/dL (ref 3.6–5.1)
Alkaline phosphatase (APISO): 56 U/L (ref 31–125)
BUN: 11 mg/dL (ref 7–25)
CO2: 28 mmol/L (ref 20–32)
Calcium: 9.4 mg/dL (ref 8.6–10.2)
Chloride: 104 mmol/L (ref 98–110)
Creat: 0.8 mg/dL (ref 0.50–1.10)
GFR, Est African American: 117 mL/min/{1.73_m2} (ref >=60)
GFR, Est Non African American: 101 mL/min/{1.73_m2} (ref >=60)
Globulin: 2.1 g/dL (calc) (ref 1.9–3.7)
Glucose, Bld: 84 mg/dL (ref 65–99)
Potassium: 4.2 mmol/L (ref 3.5–5.3)
Sodium: 139 mmol/L (ref 135–146)
Total Bilirubin: 0.4 mg/dL (ref 0.2–1.2)
Total Protein: 6.5 g/dL (ref 6.1–8.1)

## 2019-01-20 LAB — TSH: TSH: 1.42 mIU/L

## 2019-01-20 LAB — VITAMIN D 25 HYDROXY (VIT D DEFICIENCY, FRACTURES): Vit D, 25-Hydroxy: 29 ng/mL — ABNORMAL LOW (ref 30–100)

## 2019-01-20 LAB — IRON,?TOTAL/TOTAL IRON BINDING CAP
%SAT: 33 % (calc) (ref 16–45)
Iron: 127 ug/dL (ref 40–190)

## 2019-01-20 LAB — IRON, TOTAL/TOTAL IRON BINDING CAP: TIBC: 384 mcg/dL (calc) (ref 250–450)

## 2019-01-20 LAB — MAGNESIUM: Magnesium: 1.8 mg/dL (ref 1.5–2.5)

## 2019-01-20 LAB — VITAMIN B12: Vitamin B-12: 682 pg/mL (ref 200–1100)

## 2019-01-20 LAB — HEMOGLOBIN A1C
Hgb A1c MFr Bld: 5 % of total Hgb (ref <5.7)
Mean Plasma Glucose: 97 (calc)
eAG (mmol/L): 5.4 (calc)

## 2019-03-04 ENCOUNTER — Other Ambulatory Visit: Payer: Self-pay | Admitting: Adult Health

## 2019-03-04 DIAGNOSIS — M25552 Pain in left hip: Secondary | ICD-10-CM

## 2019-03-04 DIAGNOSIS — M25551 Pain in right hip: Secondary | ICD-10-CM

## 2019-03-04 NOTE — Progress Notes (Signed)
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 Symptoms improved after that with NSAIDs but now reporting recurrence and wishes to proceed with PT after discussion.

## 2019-03-14 ENCOUNTER — Other Ambulatory Visit: Payer: Self-pay | Admitting: Adult Health

## 2019-03-14 MED ORDER — MONTELUKAST SODIUM 10 MG PO TABS: 10.0000 mg | ORAL_TABLET | Freq: Every day | ORAL | 1 refills | Status: DC

## 2019-03-15 ENCOUNTER — Other Ambulatory Visit: Payer: Self-pay | Admitting: Adult Health

## 2019-03-15 MED ORDER — LEVOCETIRIZINE DIHYDROCHLORIDE 5 MG PO TABS: 5.0000 mg | ORAL_TABLET | Freq: Every evening | ORAL | 1 refills | Status: DC

## 2019-03-21 DIAGNOSIS — M25511 Pain in right shoulder: Secondary | ICD-10-CM | POA: Diagnosis not present

## 2019-03-21 DIAGNOSIS — M25551 Pain in right hip: Secondary | ICD-10-CM | POA: Diagnosis not present

## 2019-03-21 DIAGNOSIS — M545 Low back pain: Secondary | ICD-10-CM | POA: Diagnosis not present

## 2019-04-04 DIAGNOSIS — M25551 Pain in right hip: Secondary | ICD-10-CM | POA: Diagnosis not present

## 2019-04-04 DIAGNOSIS — M25511 Pain in right shoulder: Secondary | ICD-10-CM | POA: Diagnosis not present

## 2019-04-04 DIAGNOSIS — M545 Low back pain: Secondary | ICD-10-CM | POA: Diagnosis not present

## 2019-04-12 DIAGNOSIS — M25551 Pain in right hip: Secondary | ICD-10-CM | POA: Diagnosis not present

## 2019-04-12 DIAGNOSIS — M545 Low back pain: Secondary | ICD-10-CM | POA: Diagnosis not present

## 2019-04-12 DIAGNOSIS — M25511 Pain in right shoulder: Secondary | ICD-10-CM | POA: Diagnosis not present

## 2019-04-14 ENCOUNTER — Encounter: Payer: Self-pay | Admitting: Physician Assistant

## 2019-04-27 ENCOUNTER — Encounter: Payer: Self-pay | Admitting: Physician Assistant

## 2019-05-02 DIAGNOSIS — M545 Low back pain: Secondary | ICD-10-CM | POA: Diagnosis not present

## 2019-05-02 DIAGNOSIS — M25511 Pain in right shoulder: Secondary | ICD-10-CM | POA: Diagnosis not present

## 2019-05-02 DIAGNOSIS — M25551 Pain in right hip: Secondary | ICD-10-CM | POA: Diagnosis not present

## 2019-05-16 DIAGNOSIS — M25551 Pain in right hip: Secondary | ICD-10-CM | POA: Diagnosis not present

## 2019-05-16 DIAGNOSIS — M545 Low back pain: Secondary | ICD-10-CM | POA: Diagnosis not present

## 2019-05-16 DIAGNOSIS — M25511 Pain in right shoulder: Secondary | ICD-10-CM | POA: Diagnosis not present

## 2019-07-01 NOTE — L&D Delivery Note (Signed)
Delivery Note Brianna Maddox is a [id number] at [redacted]w[redacted]d who had a spontaneous delivery at 09:46 on 04/20/20 a viable female "Jace" was delivered via OA.  APGAR: 6, 8; weight 4915g (10lb13.4oz)  Admitted for labor/TOLAC at 9cm. Received epidural for pain management. Augmented with AROM. Progressed normally. Pushed for 56 minutes. After head was delivered, with excellent maternal effort the left anterior shoulder was delivered and the body followed. One nuchal cord was easily reduced. Baby was placed on maternal abdomen. Delayed cord clamping for 60 seconds. Delivery of placenta was spontaneous. Placenta was found to be intact, 3-vessel cord was noted. The fundus was found to be firm. 2nd degree perineal laceration was repaired in the normal sterile fashion with 2-0 vicryl. Bilateral periurethral lacerations repaired with 3-0 vicryl. DRE with good rectal tone and no sutures. Estimated blood loss 500cc. Instrument and gauze counts were correct at the end of the procedure. Approximately 45 minutes after delivery, [name] reported feeling lightheaded and dizzy. Noted to be pale and BP was 70-80s/40-50s with tachycardia. Bleeding minimal, uterus firm. LR bolus given and patient felt better and BP improved. CBC and BMP ordered.   Placenta status: L&D Mom to postpartum.  Baby to Couplet care / Skin to Skin.  Makyi Ledo K Taam-Akelman 04/20/2020, 10:40 AM

## 2019-07-22 NOTE — Progress Notes (Signed)
FOLLOW UP  Assessment and Plan:   Rowene was seen today for follow-up.  Diagnoses and all orders for this visit:  Asthma, unspecified asthma severity, unspecified whether complicated, unspecified whether persistent Well controlled with singulair; never started Breo but hasn't needed rescue Avoid triggers Continue antihistamine STOP smoking  Vitamin D deficiency She is not on supplement; recommend she add 2000 IU daily  Recheck at CPE  Smoker Discussed risks associated with tobacco use and advised to reduce or quit Patient is not ready to do so, but advised to consider strongly; information provided on AVS Will follow up at the next visit  Current moderate episode of major depressive disorder without prior episode (HCC) Well controlled with lexapro 10 mg daily; Continue medications  Lifestyle discussed: diet/exerise, sleep hygiene, stress management, hydration  BMI 22     Continue diet and meds as discussed. Further disposition pending results of labs. Discussed med's effects and SE's.   Over 30 minutes of exam, counseling, chart review, and critical decision making was performed.   Future Appointments  Date Time Provider Department Center  01/23/2020  3:00 PM [name], NP [facility] None    ----------------------------------------------------------------------------------------------------------------------  HPI 29 y.o. female  presents for 6 month follow up on asthma, smoking, depression and vitamin D deficiency.   She admits she has restarted smoking; has been smoking 0.5-1 pack per day, had stopped after admission for pneumonia in 10/2017, has 10 year history of cigarette.   She is on xyxal for environmental allergies; She has hx of asthma and takes singulair 10 mg daily, PRN albuterol; she has been referred to pulmonology for PFTs as last remote per patient preference, was evaluated by Dr. Jhonnie Garner and initated on [place] but she reports never started. Uses PRN  albuterol but reports rarely needs and feels well controlled.    she has a diagnosis of depression and is currently on lexapro 10 mg, reports symptoms are well controlled on current regimen.   She was referred to PT for hip/SI joint pain with normal xrays; PT was too expensive and didn't note significant improvement. Has meloxicam to use PRN.   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 Eating more since coworker bringing in food and attributes weight gain to this. Reports otherwise follows vegetarian diet and small portions.  Wt Readings from Last 3 Encounters:  07/25/19 140 lb (63.5 kg)  01/19/19 124 lb (56.2 kg)  04/19/18 122 lb (55.3 kg)   Today their BP is BP: 96/60  She does not workout. She denies chest pain, shortness of breath, dizziness.  Patient is on Vitamin D supplement.   Lab Results  Component Value Date   VD25OH 29 (L) 01/19/2019       Current Medications:  Current Outpatient Medications on File Prior to Visit  Medication Sig  . albuterol (PROVENTIL HFA;VENTOLIN HFA) 108 (90 Base) MCG/ACT inhaler Inhale 2 puffs into the lungs every 6 (six) hours as needed for wheezing or shortness of breath.  . escitalopram (LEXAPRO) 20 MG tablet Take 1 tablet (20 mg total) by mouth daily. (Patient taking differently: Take 20 mg by mouth daily. Takes 10mg  daily)  . ipratropium (ATROVENT) 0.02 % nebulizer solution Take 2.5 mLs (0.5 mg total) by nebulization every 6 (six) hours as needed for wheezing or shortness of breath.  . levocetirizine (XYZAL) 5 MG tablet Take 1 tablet (5 mg total) by mouth every evening.   montelukast (SINGULAIR) 10 MG tablet Take 1 tablet (10 mg total) by mouth at bedtime.  . meloxicam (MOBIC) 15 MG tablet Take one daily with food for 2 weeks, can take with tylenol, can not take with aleve, iburpofen, then as needed daily for pain (Patient not taking: Reported on 07/25/2019)   No  current facility-administered medications on file prior to visit.     Allergies:  Allergies  Allergen Reactions  . Apple Hives  . Prunus Persica Hives  . Raspberry Hives and Swelling  . Strawberry Extract Hives  . Peanut Oil Itching and Other (See Comments)    Swelling of lips      Medical History:  Past Medical History:  Diagnosis Date  . Abscessed tooth 2016   during pregnancy, had antibiotics  . Anxiety   . Asthma   . Benzodiazepine abuse ([place]) 04/19/2018  . Headache   . Lobar pneumonia ([place]) 10/30/2017   Family history- Reviewed and unchanged Social history- Reviewed and unchanged   Review of Systems:  Review of Systems  Constitutional: Negative for malaise/fatigue and weight loss.  HENT: Negative for hearing loss and tinnitus.   Eyes: Negative for blurred vision and double vision.  Respiratory: Negative for cough, shortness of breath and wheezing.   Cardiovascular: Negative for chest pain, palpitations, orthopnea, claudication and leg swelling.  Gastrointestinal: Negative for abdominal pain, blood in stool, constipation, diarrhea, heartburn, melena, nausea and vomiting.  Genitourinary: Negative.   Musculoskeletal: Positive for back pain (intermittent sacral/lumbar/hip pain) and joint pain. Negative for myalgias.  Skin: Negative for rash.  Neurological: Negative for dizziness, tingling, sensory change, weakness and headaches.  Endo/Heme/Allergies: Negative for polydipsia.  Psychiatric/Behavioral: Negative.   All other systems reviewed and are negative.     Physical Exam: BP 96/60   Pulse 81   Temp 97.7 F (36.5 C)   Wt 140 lb (63.5 kg)   LMP 07/23/2019 (Exact Date)   SpO2 97%   BMI 22.60 kg/m  Wt Readings from Last 3 Encounters:  07/25/19 140 lb (63.5 kg)  01/19/19 124 lb (56.2 kg)  04/19/18 122 lb (55.3 kg)   General Appearance: Well nourished, in no apparent distress. Eyes: PERRLA, EOMs, conjunctiva no swelling or erythema Sinuses: No  Frontal/maxillary tenderness ENT/Mouth: Ext aud canals clear, TMs without erythema, bulging. No erythema, swelling, or exudate on post pharynx.  Tonsils not swollen or erythematous. Hearing normal.  Neck: Supple, thyroid normal.  Respiratory: Respiratory effort normal, BS equal bilaterally without rales, rhonchi, wheezing or stridor.  Cardio: RRR with no MRGs. Brisk peripheral pulses without edema.  Abdomen: Soft, + BS.  Non tender, no guarding, rebound, hernias, masses. Lymphatics: Non tender without lymphadenopathy.  Musculoskeletal: Full ROM, 5/5 strength, Normal gait Skin: Warm, dry without rashes, lesions, ecchymosis.  Neuro: Cranial nerves intact. No cerebellar symptoms.  Psych: Awake and oriented X 3, normal affect, Insight and Judgment appropriate.    Izora Ribas, NP 4:34 PM [facility]

## 2019-07-25 ENCOUNTER — Encounter: Payer: Self-pay | Admitting: Adult Health

## 2019-07-25 ENCOUNTER — Ambulatory Visit (INDEPENDENT_AMBULATORY_CARE_PROVIDER_SITE_OTHER): Payer: BC Managed Care – PPO | Admitting: Adult Health

## 2019-07-25 ENCOUNTER — Encounter (INDEPENDENT_AMBULATORY_CARE_PROVIDER_SITE_OTHER): Payer: Self-pay

## 2019-07-25 ENCOUNTER — Other Ambulatory Visit: Payer: Self-pay

## 2019-07-25 VITALS — BP 96/60 | HR 81 | Temp 97.7°F | Wt 140.0 lb

## 2019-07-25 DIAGNOSIS — F321 Major depressive disorder, single episode, moderate: Secondary | ICD-10-CM

## 2019-07-25 DIAGNOSIS — J45909 Unspecified asthma, uncomplicated: Secondary | ICD-10-CM | POA: Diagnosis not present

## 2019-07-25 DIAGNOSIS — E559 Vitamin D deficiency, unspecified: Secondary | ICD-10-CM

## 2019-07-25 DIAGNOSIS — Z6822 Body mass index (BMI) 22.0-22.9, adult: Secondary | ICD-10-CM

## 2019-07-25 DIAGNOSIS — F17200 Nicotine dependence, unspecified, uncomplicated: Secondary | ICD-10-CM

## 2019-07-25 MED ORDER — ESCITALOPRAM OXALATE 10 MG PO TABS: 10.0000 mg | ORAL_TABLET | Freq: Every day | ORAL | 1 refills | Status: DC

## 2019-07-25 NOTE — Patient Instructions (Addendum)
Goals    . DIET - INCREASE WATER INTAKE     65+ fluid ounces daily    . Exercise 150 min/wk Moderate Activity     Start slow- 5 min at a time and gradually increase    . Quit Smoking       Get on 2000 IU of vitamin D daily -     SMOKING CESSATION  American cancer society  55732202542 for more information or for a free program for smoking cessation help.   You can call QUIT SMART 1-800-QUIT-NOW for free nicotine patches or replacement therapy- if they are out- keep calling  [facility] Can call for smoking cessation classes, [phone]  If you have a smart phone, please look up Smoke Free app, this will help you stay on track and give you information about money you have saved, life that you have gained back and a ton of more information.     ADVANTAGES OF QUITTING SMOKING  Within 20 minutes, blood pressure decreases. Your pulse is at normal level.  After 8 hours, carbon monoxide levels in the blood return to normal. Your oxygen level increases.  After 24 hours, the chance of having a heart attack starts to decrease. Your breath, hair, and body stop smelling like smoke.  After 48 hours, damaged nerve endings begin to recover. Your sense of taste and smell improve.  After 72 hours, the body is virtually free of nicotine. Your bronchial tubes relax and breathing becomes easier.  After 2 to 12 weeks, lungs can hold more air. Exercise becomes easier and circulation improves.  After 1 year, the risk of coronary heart disease is cut in half.  After 5 years, the risk of stroke falls to the same as a nonsmoker.  After 10 years, the risk of lung cancer is cut in half and the risk of other cancers decreases significantly.  After 15 years, the risk of coronary heart disease drops, usually to the level of a nonsmoker.  You will have extra money to spend on things other than cigarettes.     Back Exercises The following exercises strengthen the muscles that  help to support the trunk and back. They also help to keep the lower back flexible. Doing these exercises can help to prevent back pain or lessen existing pain.  If you have back pain or discomfort, try doing these exercises 2-3 times each day or as told by your health care provider.  As your pain improves, do them once each day, but increase the number of times that you repeat the steps for each exercise (do more repetitions).  To prevent the recurrence of back pain, continue to do these exercises once each day or as told by your health care provider. Do exercises exactly as told by your health care provider and adjust them as directed. It is normal to feel mild stretching, pulling, tightness, or discomfort as you do these exercises, but you should stop right away if you feel sudden pain or your pain gets worse. Exercises Single knee to chest Repeat these steps 3-5 times for each leg: 1. Lie on your back on a firm bed or the floor with your legs extended. 2. Bring one knee to your chest. Your other leg should stay extended and in contact with the floor. 3. Hold your knee in place by grabbing your knee or thigh with both hands and hold. 4. Pull on your knee until you feel a gentle stretch in your lower back  or buttocks. 5. Hold the stretch for 10-30 seconds. 6. Slowly release and straighten your leg. Pelvic tilt Repeat these steps 5-10 times: 1. Lie on your back on a firm bed or the floor with your legs extended. 2. Bend your knees so they are pointing toward the ceiling and your feet are flat on the floor. 3. Tighten your lower abdominal muscles to press your lower back against the floor. This motion will tilt your pelvis so your tailbone points up toward the ceiling instead of pointing to your feet or the floor. 4. With gentle tension and even breathing, hold this position for 5-10 seconds. Cat-cow Repeat these steps until your lower back becomes more flexible: 1. Get into a  hands-and-knees position on a firm surface. Keep your hands under your shoulders, and keep your knees under your hips. You may place padding under your knees for comfort. 2. Let your head hang down toward your chest. Contract your abdominal muscles and point your tailbone toward the floor so your lower back becomes rounded like the back of a cat. 3. Hold this position for 5 seconds. 4. Slowly lift your head, let your abdominal muscles relax and point your tailbone up toward the ceiling so your back forms a sagging arch like the back of a cow. 5. Hold this position for 5 seconds.  Press-ups Repeat these steps 5-10 times: 1. Lie on your abdomen (face-down) on the floor. 2. Place your palms near your head, about shoulder-width apart. 3. Keeping your back as relaxed as possible and keeping your hips on the floor, slowly straighten your arms to raise the top half of your body and lift your shoulders. Do not use your back muscles to raise your upper torso. You may adjust the placement of your hands to make yourself more comfortable. 4. Hold this position for 5 seconds while you keep your back relaxed. 5. Slowly return to lying flat on the floor.  Bridges Repeat these steps 10 times: 1. Lie on your back on a firm surface. 2. Bend your knees so they are pointing toward the ceiling and your feet are flat on the floor. Your arms should be flat at your sides, next to your body. 3. Tighten your buttocks muscles and lift your buttocks off the floor until your waist is at almost the same height as your knees. You should feel the muscles working in your buttocks and the back of your thighs. If you do not feel these muscles, slide your feet 1-2 inches farther away from your buttocks. 4. Hold this position for 3-5 seconds. 5. Slowly lower your hips to the starting position, and allow your buttocks muscles to relax completely. If this exercise is too easy, try doing it with your arms crossed over your  chest. Abdominal crunches Repeat these steps 5-10 times: 1. Lie on your back on a firm bed or the floor with your legs extended. 2. Bend your knees so they are pointing toward the ceiling and your feet are flat on the floor. 3. Cross your arms over your chest. 4. Tip your chin slightly toward your chest without bending your neck. 5. Tighten your abdominal muscles and slowly raise your trunk (torso) high enough to lift your shoulder blades a tiny bit off the floor. Avoid raising your torso higher than that because it can put too much stress on your low back and does not help to strengthen your abdominal muscles. 6. Slowly return to your starting position. Back lifts Repeat these steps  5-10 times: 1. Lie on your abdomen (face-down) with your arms at your sides, and rest your forehead on the floor. 2. Tighten the muscles in your legs and your buttocks. 3. Slowly lift your chest off the floor while you keep your hips pressed to the floor. Keep the back of your head in line with the curve in your back. Your eyes should be looking at the floor. 4. Hold this position for 3-5 seconds. 5. Slowly return to your starting position. Contact a health care provider if:  Your back pain or discomfort gets much worse when you do an exercise.  Your worsening back pain or discomfort does not lessen within 2 hours after you exercise. If you have any of these problems, stop doing these exercises right away. Do not do them again unless your health care provider says that you can. Get help right away if:  You develop sudden, severe back pain. If this happens, stop doing the exercises right away. Do not do them again unless your health care provider says that you can. This information is not intended to replace advice given to you by your health care provider. Make sure you discuss any questions you have with your health care provider. Document Revised: 10/21/2018 Document Reviewed: 03/18/2018 Elsevier Patient  Education  2020 [employer].

## 2019-07-30 DIAGNOSIS — B349 Viral infection, unspecified: Secondary | ICD-10-CM | POA: Diagnosis not present

## 2019-07-30 DIAGNOSIS — Z1152 Encounter for screening for COVID-19: Secondary | ICD-10-CM | POA: Diagnosis not present

## 2019-08-19 ENCOUNTER — Encounter: Payer: Self-pay | Admitting: Adult Health

## 2019-08-19 DIAGNOSIS — Z3490 Encounter for supervision of normal pregnancy, unspecified, unspecified trimester: Secondary | ICD-10-CM | POA: Insufficient documentation

## 2019-09-08 ENCOUNTER — Ambulatory Visit (INDEPENDENT_AMBULATORY_CARE_PROVIDER_SITE_OTHER): Payer: BC Managed Care – PPO | Admitting: Adult Health Nurse Practitioner

## 2019-09-08 ENCOUNTER — Other Ambulatory Visit: Payer: Self-pay

## 2019-09-08 ENCOUNTER — Encounter: Payer: Self-pay | Admitting: Adult Health Nurse Practitioner

## 2019-09-08 VITALS — BP 110/74 | HR 68 | Temp 97.7°F | Wt 135.0 lb

## 2019-09-08 DIAGNOSIS — H65193 Other acute nonsuppurative otitis media, bilateral: Secondary | ICD-10-CM

## 2019-09-08 DIAGNOSIS — H65113 Acute and subacute allergic otitis media (mucoid) (sanguinous) (serous), bilateral: Secondary | ICD-10-CM

## 2019-09-08 DIAGNOSIS — Z1152 Encounter for screening for COVID-19: Secondary | ICD-10-CM | POA: Diagnosis not present

## 2019-09-08 DIAGNOSIS — H6123 Impacted cerumen, bilateral: Secondary | ICD-10-CM

## 2019-09-08 DIAGNOSIS — J00 Acute nasopharyngitis [common cold]: Secondary | ICD-10-CM

## 2019-09-08 DIAGNOSIS — Z3A01 Less than 8 weeks gestation of pregnancy: Secondary | ICD-10-CM | POA: Diagnosis not present

## 2019-09-08 DIAGNOSIS — Z7189 Other specified counseling: Secondary | ICD-10-CM

## 2019-09-08 DIAGNOSIS — Z03818 Encounter for observation for suspected exposure to other biological agents ruled out: Secondary | ICD-10-CM | POA: Diagnosis not present

## 2019-09-08 MED ORDER — AMOXICILLIN 500 MG PO TABS: 500.0000 mg | ORAL_TABLET | Freq: Three times a day (TID) | ORAL | 0 refills | Status: DC

## 2019-09-08 NOTE — Progress Notes (Signed)
Assessment and Plan:  Christyana was seen today for sinus problem, headache, generalized body aches and nasal congestion.  Diagnoses and all orders for this visit:  Acute mucoid otitis media of both ears -     amoxicillin (AMOXIL) 500 MG tablet; Take 1 tablet (500 mg total) by mouth 3 (three) times daily. 10 days Discussed ear hygiene, printed educated provided  Acute rhinitis Discussed OTC flonase OTC antihistamine  Bilateral impacted cerumen Discussed ear hygiene, printed educated provided  Pregnancy with 7 completed weeks gestation Discussed medications safe to take during pregnancy Printed educational materials provided First OB visit in 3 weeks Dr Rogue Bussing  Educated about COVID-19 virus infection Discussed getting VOID test as she care for her elderly granandmother  Contact office with any new or worsening symptoms.   Further disposition pending results of labs. Discussed med's effects and SE's.   Over 30 minutes of face to face interview, exam, counseling, chart review, and critical decision making was performed.   Future Appointments  Date Time Provider Ag[place]  01/23/2020  3:00 PM Liane Comber, NP [facility] None    ------------------------------------------------------------------------------------------------------------------   HPI: 29 y.o.female presents for evaluation of sinus symptoms.  She reports that this started three days ago.  She is having nasal congestion, headache, face hurts, body aches in armpits.  She is also pregnant, 7 weeks and has been excessively tired and nauseated.  She reports she has had decreased appetite.  She has taken benendryl and tylenol for her facial pain.  She is having lots of popping and cracking in her ears.  She has her first appointment with Dr Rogue Bussing next Friday for her first OB visit.    Past Medical History:  Diagnosis Date  . Abscessed tooth 2016   during pregnancy, had antibiotics  . Anxiety   .  Asthma   . Benzodiazepine abuse ([facility]) 04/19/2018  . Headache   . Lobar pneumonia ([place]) 10/30/2017     Allergies  Allergen Reactions  . Apple Hives  . Prunus Persica Hives  . Raspberry Hives and Swelling  . Strawberry Extract Hives  . Peanut Oil Itching and Other (See Comments)    Swelling of lips     Current Outpatient Medications on File Prior to Visit  Medication Sig  . albuterol (PROVENTIL HFA;VENTOLIN HFA) 108 (90 Base) MCG/ACT inhaler Inhale 2 puffs into the lungs every 6 (six) hours as needed for wheezing or shortness of breath.  . escitalopram (LEXAPRO) 10 MG tablet Take 1 tablet (10 mg total) by mouth daily.  Marland Kitchen ipratropium (ATROVENT) 0.02 % nebulizer solution Take 2.5 mLs (0.5 mg total) by nebulization every 6 (six) hours as needed for wheezing or shortness of breath.  . levocetirizine (XYZAL) 5 MG tablet Take 1 tablet (5 mg total) by mouth every evening.  . meloxicam (MOBIC) 15 MG tablet Take one daily with food for 2 weeks, can take with tylenol, can not take with aleve, iburpofen, then as needed daily for pain  . montelukast (SINGULAIR) 10 MG tablet Take 1 tablet (10 mg total) by mouth at bedtime.   No current facility-administered medications on file prior to visit.    ROS: all negative except above.   Physical Exam:  BP 110/74   Pulse 68   Temp 97.7 F (36.5 C)   Wt 135 lb (61.2 kg)   SpO2 97%   BMI 21.79 kg/m   General Appearance: Well nourished, in no apparent distress. Eyes: PERRLA, EOMs, conjunctiva no swelling or erythema Sinuses: Frontal/maxillary tenderness  ENT/Mouth: Ext aud canals blocked cerumen. No erythema, swelling, or exudate on post pharynx.  Tonsils not swollen or erythematous. Hearing normal.  Neck: Supple, thyroid normal.  Respiratory: Respiratory effort normal, BS equal bilaterally without rales, rhonchi, wheezing or stridor.  Cardio: RRR with no MRGs. Brisk peripheral pulses without edema.  Abdomen: Soft, + BS.  Non tender, no guarding,  rebound, hernias, masses. Lymphatics: Non tender without lymphadenopathy.  Musculoskeletal: Full ROM, 5/5 strength, normal gait.  Skin: Warm, dry without rashes, lesions, ecchymosis.  Neuro: Cranial nerves intact. Normal muscle tone, no cerebellar symptoms. Sensation intact.  Psych: Awake and oriented X 3, normal affect, Insight and Judgment appropriate.     Elder Negus, NP 11:24 AM [facility]

## 2019-09-08 NOTE — Patient Instructions (Addendum)
We will send in amoxacillin to your pharmacy   Ear care: Both of your ears are clear of wax at this time Use 2-3 drops of oil for next 3-4 nights.  This will help to sooth your ear canals.  Use cotton ball to keep in ears while sleeping.  For regular maintenance: Use warm or room temp peroxide in ear once every 1-2 weeks, then apply oil that night x1.   Next morning let warm water from shower run into your ears.   This will help flush out any wax Do not use cotton swabs in your ears Should you have pain, decrease in your ability to hear or dizziness please contact the office for an appointment.   Use saline spray for your nose.  You may use tylenol for pain  Refer to list provided:    SAFE MEDICATIONS DURING PREGNANCY Allergies Claritin, Benadryl, Zyrtec Colds, cough, Tylenol, Tylenol Cold, Robitussin/Robitussin DM Mucinex, Chloraseptic, sore throat Cepacol Lozenges Actifed*, Sudafed*, Contac*, Drixoral*, Chlortrimeton*. *Decongestants should not be used the first 12 weeks of pregnancy or if you have high blood pressure Call: If fever over 101.8, no improvement in 2-3 days, anytime patient is concerned by symptoms. Constipation Milk of Magnesia. Miralax (Constipation is best prevented during pregnancy by generous drinking of water (8 glasses/day), fresh fruit, regular exercise, and stool softeners. ____________________________________________________________________________________________________ Stool Softeners Colace/Sodium Docusate, Metamucil, Citrucel, Fibercon, Benefiber ____________________________________________________________________________________________________ Diarrhea Kaopectate, Immodium, BRAT diet (bananas, rice, applesauce, tea) Call: If fever, weak/dizzy, no improvement in 2-3 days ____________________________________________________________________________________________________ Headache Tylenol/Acetaminophen (regular or extra strength) Datril,  Panadol (Do not take Aspirin, Advil, Motrin, Ibuprofen, Aleve, Naproxen, sodium) Call: If dizziness or blurred vision ____________________________________________________________________________________________________ Indigestion Mylanta, Maalox, Tums, Rolaids, Zantac, Pepcid, Prilosec* (liquid antacids generally work better than tablets) *Avoid Prilosec in first trimester Call: If severe abdominal pain ____________________________________________________________________________________________________ Hemorrhoids Preparation H, Anusol. Avoid constipation, straining ____________________________________________________________________________________________________ Nose bleeds or Common in pregnancy  gum bleeding Call: If persistent or patient is worried ____________________________________________________________________________________________________ Nausea Emetrol, Vitamin B6 10-25 mg (three times daily) Call: If unable to keep liquids down for more than 24 hours, weak and dizzy (dehydrated) ____________________________________________________________________________________________________ Yeast Infections Monistat - 7 day Call: If no improvement after 7 day regimen ____________________________________________________________________________________________________ Dental Dentist appointment - X-Rays with abdominal shield, Novocain without epinephrine ____________________________________________________________________________________________________ Injections Allergy shots, Tetanus, TB Test, Flu shots ____________________________________________________________________________________________________ **ALWAYS CALL FOR THE FOLLOWING: LABOR PAINS, BROKEN WATER, VAGINAL BLEEDING, BABY MOVING LESS THAN USUAL OR IF YOU HAVE CONCERNS.

## 2019-09-11 DIAGNOSIS — R05 Cough: Secondary | ICD-10-CM | POA: Diagnosis not present

## 2019-09-12 DIAGNOSIS — O26851 Spotting complicating pregnancy, first trimester: Secondary | ICD-10-CM | POA: Diagnosis not present

## 2019-09-12 DIAGNOSIS — N925 Other specified irregular menstruation: Secondary | ICD-10-CM | POA: Diagnosis not present

## 2019-09-12 DIAGNOSIS — Z3201 Encounter for pregnancy test, result positive: Secondary | ICD-10-CM | POA: Diagnosis not present

## 2019-09-12 DIAGNOSIS — Z6822 Body mass index (BMI) 22.0-22.9, adult: Secondary | ICD-10-CM | POA: Diagnosis not present

## 2019-09-16 DIAGNOSIS — Z113 Encounter for screening for infections with a predominantly sexual mode of transmission: Secondary | ICD-10-CM | POA: Diagnosis not present

## 2019-09-16 DIAGNOSIS — Z3490 Encounter for supervision of normal pregnancy, unspecified, unspecified trimester: Secondary | ICD-10-CM | POA: Diagnosis not present

## 2019-09-16 DIAGNOSIS — Z124 Encounter for screening for malignant neoplasm of cervix: Secondary | ICD-10-CM | POA: Diagnosis not present

## 2019-09-16 DIAGNOSIS — O26859 Spotting complicating pregnancy, unspecified trimester: Secondary | ICD-10-CM | POA: Diagnosis not present

## 2019-09-16 DIAGNOSIS — Z6822 Body mass index (BMI) 22.0-22.9, adult: Secondary | ICD-10-CM | POA: Diagnosis not present

## 2019-09-21 ENCOUNTER — Other Ambulatory Visit: Payer: Self-pay | Admitting: Adult Health

## 2019-09-21 MED ORDER — LEVOCETIRIZINE DIHYDROCHLORIDE 5 MG PO TABS: 5.0000 mg | ORAL_TABLET | Freq: Every evening | ORAL | 1 refills | Status: DC

## 2019-10-17 ENCOUNTER — Encounter: Payer: Self-pay | Admitting: Adult Health

## 2019-10-17 ENCOUNTER — Other Ambulatory Visit: Payer: Self-pay

## 2019-10-17 ENCOUNTER — Other Ambulatory Visit: Payer: Self-pay | Admitting: Adult Health

## 2019-10-17 ENCOUNTER — Ambulatory Visit (INDEPENDENT_AMBULATORY_CARE_PROVIDER_SITE_OTHER): Payer: BC Managed Care – PPO | Admitting: Adult Health

## 2019-10-17 VITALS — BP 98/56 | HR 91 | Temp 97.5°F | Wt 140.0 lb

## 2019-10-17 DIAGNOSIS — J019 Acute sinusitis, unspecified: Secondary | ICD-10-CM | POA: Diagnosis not present

## 2019-10-17 DIAGNOSIS — Z03818 Encounter for observation for suspected exposure to other biological agents ruled out: Secondary | ICD-10-CM | POA: Diagnosis not present

## 2019-10-17 DIAGNOSIS — Z1152 Encounter for screening for COVID-19: Secondary | ICD-10-CM | POA: Diagnosis not present

## 2019-10-17 MED ORDER — MONTELUKAST SODIUM 10 MG PO TABS: 10.0000 mg | ORAL_TABLET | Freq: Every day | ORAL | 1 refills | Status: DC

## 2019-10-17 MED ORDER — FLUTICASONE PROPIONATE 50 MCG/ACT NA SUSP: 1.0000 | Freq: Two times a day (BID) | NASAL | 1 refills | Status: DC | PRN

## 2019-10-17 NOTE — Progress Notes (Signed)
Assessment and Plan:  Piera was seen today for sinusitis and sore throat.  Diagnoses and all orders for this visit:  Acute rhinosinusitis Reassuring exam  Reviewed current guidelines and discussed the importance of avoiding unnecessary antibiotic therapy Suggested symptomatic OTC remedies. Nasal saline spray for congestion. Nasal steroids Continue xyzal, singulaire Follow up as needed for persistent symptoms lasting longer than 10-14 days, or with progression/significant worsening of sx consider augmentin She will get drive through covid 19 testing as per work policy though my suspicion for this is fairly low.  -     fluticasone (FLONASE) 50 MCG/ACT nasal spray; Place 1-2 sprays into both nostrils 2 (two) times daily as needed for allergies or rhinitis.  Further disposition pending results of labs. Discussed med's effects and SE's.   Over 15 minutes of exam, counseling, chart review, and critical decision making was performed.   Future Appointments  Date Time Provider Department Center  01/23/2020  3:00 PM [name], NP [facility] None    ------------------------------------------------------------------------------------------------------------------   HPI BP (!) 98/56   Pulse 91   Temp (!) 97.5 F (36.4 C)   Wt 140 lb (63.5 kg)   SpO2 99%   BMI 22.60 kg/m   29 y.o.female former smoker, hx of allergies, currently [redacted] weeks pregnant, presents for evaluation of URI/sinus symptoms that began yesterday.   Woke up yesterday with congestion, burning and pressure worse on R, worse last evening, sinus headache, pressure in teeth and eyes last night. Currently with improved sinus headache (improved with hot towel over face). Denies fever/chills, dizziness, GI sx. Denies cough, dyspnea, wheezing. Endorses some mild scratchiness in throat. Denies ear pain/pressure.   She typically takes singulair and xyzal, ran out of singulair 1 week ago, restarted over the weekend, thinks this  may have contributed.   Hasn't tried any nasal sprays.   No known sick contacts; she is staying home from work due to their policy, will be getting drive through testing  Past Medical History:  Diagnosis Date  . Abscessed tooth 2016   during pregnancy, had antibiotics  . Anxiety   . Asthma   . Benzodiazepine abuse ([facility]) 04/19/2018  . Headache   . Lobar pneumonia ([facility]) 10/30/2017     Allergies  Allergen Reactions  . Apple Hives  . Prunus Persica Hives  . Raspberry Hives and Swelling  . Strawberry Extract Hives  . Peanut Oil Itching and Other (See Comments)    Swelling of lips     Current Outpatient Medications on File Prior to Visit  Medication Sig  . albuterol (PROVENTIL HFA;VENTOLIN HFA) 108 (90 Base) MCG/ACT inhaler Inhale 2 puffs into the lungs every 6 (six) hours as needed for wheezing or shortness of breath.  . escitalopram (LEXAPRO) 10 MG tablet Take 1 tablet (10 mg total) by mouth daily.  Marland Kitchen ipratropium (ATROVENT) 0.02 % nebulizer solution Take 2.5 mLs (0.5 mg total) by nebulization every 6 (six) hours as needed for wheezing or shortness of breath.  . levocetirizine (XYZAL) 5 MG tablet Take 1 tablet (5 mg total) by mouth every evening.  . montelukast (SINGULAIR) 10 MG tablet Take 1 tablet (10 mg total) by mouth at bedtime.  Marland Kitchen amoxicillin (AMOXIL) 500 MG tablet Take 1 tablet (500 mg total) by mouth 3 (three) times daily. 10 days  . meloxicam (MOBIC) 15 MG tablet Take one daily with food for 2 weeks, can take with tylenol, can not take with aleve, iburpofen, then as needed daily for pain (Patient not taking: Reported  on 10/17/2019)   No current facility-administered medications on file prior to visit.    ROS: all negative except above.   Physical Exam:  BP (!) 98/56   Pulse 91   Temp (!) 97.5 F (36.4 C)   Wt 140 lb (63.5 kg)   SpO2 99%   BMI 22.60 kg/m   General Appearance: Well nourished, in no apparent distress. Eyes: PERRLA, conjunctiva no swelling or  erythema Sinuses: Generalized Frontal/maxillary tenderness, worse on R without overlying erythema, swelling ENT/Mouth: Ext aud canals clear, TMs without erythema, bulging. Pale swollen turbinates bilaterally. Clear discharge. No erythema, swelling, or exudate on post pharynx.  Tonsils not swollen or erythematous. Hearing normal.  Neck: Supple Respiratory: Respiratory effort normal, BS equal bilaterally without rales, rhonchi, wheezing or stridor.  Cardio: RRR with no MRGs. Brisk peripheral pulses without edema.  Abdomen: Soft, + BS.   Lymphatics: Non tender without lymphadenopathy.  Musculoskeletal:  normal gait.  Skin: Warm, dry without rashes, lesions, ecchymosis.  Neuro: Normal muscle tone Psych: Awake and oriented X 3, normal affect, Insight and Judgment appropriate.     Izora Ribas, NP 11:39 AM [facility]

## 2019-10-17 NOTE — Patient Instructions (Addendum)
Fluticasone 1-2 sprays in each nostril twice daily  Liberal use of saline nasal sprays      Common Medications Safe in Pregnancy  Acne:      Constipation:  Benzoyl Peroxide     Colace  Clindamycin      Dulcolax Suppository  Topica Erythromycin     Fibercon  Salicylic Acid      Metamucil         Miralax AVOID:        Senakot   Accutane    Cough:  Retin-A       Cough Drops  Tetracycline      Phenergan w/ Codeine if Rx  Minocycline      Robitussin (Plain & DM)  Antibiotics:     Crabs/Lice:  Ceclor       RID  Cephalosporins    AVOID:  E-Mycins      Kwell  Keflex  Macrobid/Macrodantin   Diarrhea:  Penicillin      Kao-Pectate  Zithromax      Imodium AD         PUSH FLUIDS AVOID:       Cipro     Fever:  Tetracycline      Tylenol (Regular or Extra  Minocycline       Strength)  Levaquin      Extra Strength-Do not          Exceed 8 tabs/24 hrs Caffeine:        <283m/day (equiv. To 1 cup of coffee or  approx. 3 12 oz sodas)         Gas: Cold/Hayfever:       Gas-X  Benadryl      Mylicon  Claritin       Phazyme  **Claritin-D        Chlor-Trimeton    Headaches:  Dimetapp      ASA-Free Excedrin  Drixoral-Non-Drowsy     Cold Compress  Mucinex (Guaifenasin)     Tylenol (Regular or Extra  Sudafed/Sudafed-12 Hour     Strength)  **Sudafed PE Pseudoephedrine   Tylenol Cold & Sinus     Vicks Vapor Rub  Zyrtec  **AVOID if Problems With Blood Pressure         Heartburn: Avoid lying down for at least 1 hour after meals  Aciphex      Maalox     Rash:  Milk of Magnesia     Benadryl    Mylanta       1% Hydrocortisone Cream  Pepcid  Pepcid Complete   Sleep Aids:  Prevacid      Ambien   Prilosec       Benadryl  Rolaids       Chamomile Tea  Tums (Limit 4/day)     Unisom  Zantac       Tylenol PM         Warm milk-add vanilla or  Hemorrhoids:       Sugar for taste  Anusol/Anusol H.C.  (RX: Analapram 2.5%)  Sugar Substitutes:  Hydrocortisone OTC     Ok in  moderation  Preparation H      Tucks        Vaseline lotion applied to tissue with wiping    Herpes:     Throat:  Acyclovir      Oragel  Famvir  Valtrex     Vaccines:         Flu Shot Leg Cramps:       *Gardasil  Benadryl      Hepatitis A         Hepatitis B Nasal Spray:       Pneumovax  Saline Nasal Spray     Polio Booster         Tetanus Nausea:       Tuberculosis test or PPD  Vitamin B6 25 mg TID   AVOID:    Dramamine      *Gardasil  Emetrol       Live Poliovirus  Ginger Root 250 mg QID    MMR (measles, mumps &  High Complex Carbs @ Bedtime    rebella)  Sea Bands-Accupressure    Varicella (Chickenpox)  Unisom 1/2 tab TID     *No known complications           If received before Pain:         Known pregnancy;   Darvocet       Resume series after  Lortab        Delivery  Percocet    Yeast:   Tramadol      Femstat  Tylenol 3      Gyne-lotrimin  Ultram       Monistat  Vicodin           MISC:         All Sunscreens           Hair Coloring/highlights          Insect Repellant's          (Including DEET)         Mystic Tans

## 2019-10-19 DIAGNOSIS — Z3480 Encounter for supervision of other normal pregnancy, unspecified trimester: Secondary | ICD-10-CM | POA: Diagnosis not present

## 2019-10-19 DIAGNOSIS — Z3682 Encounter for antenatal screening for nuchal translucency: Secondary | ICD-10-CM | POA: Diagnosis not present

## 2019-11-08 ENCOUNTER — Other Ambulatory Visit: Payer: Self-pay | Admitting: Adult Health

## 2019-11-17 DIAGNOSIS — Z369 Encounter for antenatal screening, unspecified: Secondary | ICD-10-CM | POA: Diagnosis not present

## 2019-11-17 DIAGNOSIS — Z3482 Encounter for supervision of other normal pregnancy, second trimester: Secondary | ICD-10-CM | POA: Diagnosis not present

## 2019-11-23 ENCOUNTER — Other Ambulatory Visit: Payer: Self-pay | Admitting: Adult Health

## 2020-01-16 ENCOUNTER — Other Ambulatory Visit: Payer: Self-pay | Admitting: Adult Health

## 2020-01-16 DIAGNOSIS — F321 Major depressive disorder, single episode, moderate: Secondary | ICD-10-CM

## 2020-01-20 ENCOUNTER — Encounter: Payer: Self-pay | Admitting: Adult Health

## 2020-01-20 NOTE — Progress Notes (Deleted)
Complete Physical  Assessment and Plan:  Brianna Maddox was seen today for annual exam.  Diagnoses and all orders for this visit:  Encounter for routine adult health examination with abnormal findings  Asthma, unspecified asthma severity, unspecified whether complicated, unspecified whether persistent Continue meds, advised to quit smoking Has been referred to pulm, pending PFTs as last remote and having some dyspnea, ? Also with mild COPD  Multiple food allergies Avoid triggers  Menorrhagia with regular cycle F/u GYN Stress management techniques discussed, increase water, good sleep hygiene discussed, increase exercise, and increase veggies.   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  -     escitalopram (LEXAPRO) 20 MG tablet; Take 1 tablet (20 mg total) by mouth daily.  Smoker Discussed risks associated with tobacco use and advised to reduce or quit Patient is ready to do so and plans to taper  Declines Chantix or other medication Will follow up at the next visit  Fatigue, unspecified type -     CBC with Differential/Platelet -     TSH -     Vitamin B12 -     Iron,Total/Total Iron Binding Cap  Medication management -     CBC with Differential/Platelet -     COMPLETE METABOLIC PANEL WITH GFR -     Magnesium -     Urinalysis, Routine w reflex microscopic  Anemia, unspecified type -     CBC with Differential/Platelet -     Vitamin B12 -     Iron,Total/Total Iron Binding Cap  Abnormal glucose -     Hemoglobin A1c  Screening for diabetes mellitus -     Hemoglobin A1c  Vitamin D deficiency -     VITAMIN D 25 Hydroxy (Vit-D Deficiency, Fractures)  Paresthesias Unclear etiology; check labs, TSH, CBC, B12, will work on  lifestyle, stress management Consider MRI vs neurology referral if persistent at follow up  Discussed med's effects and SE's. Screening labs and tests as requested with regular follow-up as recommended. Over 40 minutes of exam, counseling, chart review, and complex, high level critical decision making was performed this visit.   Future Appointments  Date Time Provider Department Center  01/23/2020  3:00 PM [name], NP [facility] None  01/23/2021  3:00 PM [name], NP [facility] None     HPI  29 y.o. 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   She has significant other, 1 child 90 y/o boy. 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   *** pregnant, high deductible, minimal labs *** probl won't show ***   She admits she has restarted smoking; has been smoking a pack per day for the last several weeks, has 10 year history of cigarette.   She is on xyxal for environmental allergies  She has hx of asthma and takes singulair 10 mg daily, PRN albuterol; she has been referred to pulmonology for PFTs as last remote per patient preference, pending evaluation by Dr. Alroy Dust.   she has a diagnosis of depression and is currently on lexapro 10 mg, reports symptoms are well controlled on current regimen. She  has increased stress r/t covid, snapping at SO and family, has reached out to a therapist to start counseling.   She reports for the past year when doing yoga she has intermittent numbness/tingling of upper and lower extremities. Denies notable weakness. Does awaken at night sometimes with numb arms. Does have long history of neck and back pain though this is recently improved.   BMI is There is no height or weight on file to  calculate BMI., she has been working on diet, admits to minimal exercise.  Wt Readings from Last 3 Encounters:  10/17/19 140 lb (63.5 kg)  09/08/19 135 lb (61.2 kg)  07/25/19 140 lb (63.5 kg)   Today their BP is   She does not workout. She denies chest pain, shortness of breath, dizziness.   Last GFR: Lab Results  Component Value Date   GFRNONAA 101 01/19/2019    Patient reports hx of vitamin D deficiency and has been on high dose supplements in the past Lab Results  Component Value Date   VD25OH 29 (L) 01/19/2019      Lab Results  Component Value Date   WBC 7.1 01/19/2019   HGB 12.3 01/19/2019   HCT 37.0 01/19/2019   MCV 94.9 01/19/2019   PLT 196 01/19/2019   Lab Results  Component Value Date   IRON 127 01/19/2019   TIBC 384 01/19/2019   Lab Results  Component Value Date   VITAMINB12 682 01/19/2019     Current Medications:  Current Outpatient Medications on File Prior to Visit  Medication Sig Dispense Refill  . albuterol (PROVENTIL HFA;VENTOLIN HFA) 108 (90 Base) MCG/ACT inhaler Inhale 2 puffs into the lungs every 6 (six) hours as needed for wheezing or shortness of breath. 1 Inhaler 0  . escitalopram (LEXAPRO) 10 MG tablet Take 1 tablet (10 mg total) by mouth daily. 90 tablet 1  . escitalopram (LEXAPRO) 20 MG tablet TAKE ONE TABLET BY MOUTH DAILY 90 tablet 0  . fluticasone (FLONASE) 50 MCG/ACT nasal spray PLACE 1-2 SPRAYS INTO BOTH NOSTRILS 2 (TWO) TIMES DAILY AS NEEDED FOR ALLERGIES OR RHINITIS. 48 mL 1  . ipratropium (ATROVENT) 0.02 % nebulizer solution Take 2.5 mLs (0.5 mg total) by nebulization every 6 (six) hours as needed for wheezing or shortness of breath. 75 mL 0  . levocetirizine (XYZAL) 5 MG tablet Take 1 tablet (5 mg total) by mouth every evening. 90 tablet 1  . montelukast (SINGULAIR) 10 MG tablet Take 1 tablet (10 mg total) by mouth at bedtime. 90 tablet 1   No current facility-administered medications on file prior to visit.   Allergies:   Allergies  Allergen Reactions  . Apple Hives  . Prunus Persica Hives  . Raspberry Hives and Swelling  . Strawberry Extract Hives  . Peanut Oil Itching and Other (See Comments)    Swelling of lips    Medical History:  She has Asthma; Menorrhagia; Multiple food allergies; Current moderate episode of major depressive disorder without prior episode (HCC); Smoker; Vitamin D deficiency; Paresthesias; and Pregnant on their problem list. Health Maintenance:   Immunization History  Administered Date(s) Administered  . Influenza,inj,Quad PF,6+ Mos 05/26/2015  . Pneumococcal Polysaccharide-23 05/26/2015    Tetanus: 2016 Pneumovax: 2016 Flu vaccine:  HPV: ? Has had, will check with peds  LMP: No LMP recorded. (Menstrual status: Irregular Periods). Pap: 2016, negative, ? Neg HPV *** MGM: never  Colonoscopy: n/a EGD: n/a  Last Dental Exam: Dr. Lawrence Marseilles, 2020, goes q78m Last Eye Exam: Dr. Emily Filbert,  last exam 2020, glasses  Patient Care Team: Lucky Cowboy, MD as PCP - General (Internal Medicine)  Surgical History:  She has a past surgical history that includes Cesarean section (N/A, 05/24/2015). Family History:  Herfamily history includes Anxiety disorder in her mother; Drug abuse in her mother; Thyroid disease in her mother. She was adopted. Social History:  She reports that she quit smoking about 4 months ago. Her smoking use included cigarettes. She has a 12.00 pack-year smoking history. She has never used smokeless tobacco. She reports current alcohol use. She reports previous drug use. Drug: Benzodiazepines.  Review of Systems: *** Review of Systems  Constitutional: Positive for malaise/fatigue. Negative for weight loss.  HENT: Negative for hearing loss and tinnitus.   Eyes: Negative for blurred vision and double vision.  Respiratory: Negative for cough, shortness of breath and wheezing.   Cardiovascular: Negative for chest pain, palpitations, orthopnea, claudication and leg  swelling.  Gastrointestinal: Negative for abdominal pain, blood in stool, constipation, diarrhea, heartburn, melena, nausea and vomiting.  Genitourinary: Negative.   Musculoskeletal: Negative for back pain, joint pain, myalgias and neck pain.  Skin: Negative for rash.  Neurological: Positive for dizziness and tingling (intermittent numbness/tingling of bilateral arms/lower legs). Negative for sensory change, weakness and headaches.  Endo/Heme/Allergies: Negative for polydipsia.  Psychiatric/Behavioral: Positive for depression. Negative for memory loss, substance abuse and suicidal ideas. The patient is nervous/anxious. The patient does not have insomnia.   All other systems reviewed and are negative.   Physical Exam: Estimated body mass index is 22.6 kg/m as calculated from the following:   Height as of 01/19/19: 5\' 6"  (1.676 m).   Weight as of 10/17/19: 140 lb (63.5 kg). There were no vitals taken for this visit. General Appearance: Well nourished, in no apparent distress.  Eyes: PERRLA, EOMs, conjunctiva no swelling or erythema, normal fundi and vessels.  Sinuses: No Frontal/maxillary tenderness  ENT/Mouth: Ext aud canals clear, normal light reflex with TMs without erythema, bulging. Good dentition. No erythema, swelling, or exudate on post pharynx. Tonsils not swollen or erythematous. Hearing normal.  Neck: Supple, thyroid normal. No bruits  Respiratory: Respiratory effort normal, BS equal bilaterally without rales, rhonchi, wheezing or stridor.  Cardio: RRR without murmurs, rubs or gallops. Brisk peripheral pulses without edema.  Chest: symmetric, with normal excursions and percussion.  Breasts: Symmetric, without lumps, nipple discharge, retractions.   Abdomen: Soft, nontender, no guarding, rebound, hernias, masses, or organomegaly.  Lymphatics: Non tender without lymphadenopathy.  Genitourinary: defer to GYN *** Musculoskeletal: Full ROM all peripheral extremities,5/5 strength, and  normal gait.  Skin: Warm, dry without rashes, lesions, ecchymosis. Neuro: Cranial nerves intact, reflexes equal bilaterally. Normal muscle tone, no cerebellar symptoms. Sensation intact.  Psych: Awake and oriented X 3, normal affect, Insight and Judgment appropriate.   EKG: WNL in chart from 2019; defer   2020 Revella Shelton 4:00 PM [facility]

## 2020-01-23 ENCOUNTER — Encounter: Payer: Self-pay | Admitting: Adult Health

## 2020-01-23 DIAGNOSIS — F17200 Nicotine dependence, unspecified, uncomplicated: Secondary | ICD-10-CM

## 2020-01-23 DIAGNOSIS — Z3490 Encounter for supervision of normal pregnancy, unspecified, unspecified trimester: Secondary | ICD-10-CM

## 2020-01-23 DIAGNOSIS — F321 Major depressive disorder, single episode, moderate: Secondary | ICD-10-CM

## 2020-01-23 DIAGNOSIS — Z0000 Encounter for general adult medical examination without abnormal findings: Secondary | ICD-10-CM

## 2020-01-23 DIAGNOSIS — J45909 Unspecified asthma, uncomplicated: Secondary | ICD-10-CM

## 2020-01-23 DIAGNOSIS — E559 Vitamin D deficiency, unspecified: Secondary | ICD-10-CM

## 2020-01-30 ENCOUNTER — Inpatient Hospital Stay (HOSPITAL_COMMUNITY)
Admission: AD | Admit: 2020-01-30 | Discharge: 2020-01-30 | Disposition: A | Discharge status: Home / Self Care | Payer: Medicaid Other | Attending: Obstetrics | Admitting: Obstetrics

## 2020-01-30 ENCOUNTER — Other Ambulatory Visit: Payer: Self-pay

## 2020-01-30 ENCOUNTER — Encounter (HOSPITAL_COMMUNITY): Payer: Self-pay | Admitting: *Deleted

## 2020-01-30 DIAGNOSIS — O99342 Other mental disorders complicating pregnancy, second trimester: Secondary | ICD-10-CM | POA: Insufficient documentation

## 2020-01-30 DIAGNOSIS — J45909 Unspecified asthma, uncomplicated: Secondary | ICD-10-CM | POA: Diagnosis not present

## 2020-01-30 DIAGNOSIS — Z79899 Other long term (current) drug therapy: Secondary | ICD-10-CM | POA: Insufficient documentation

## 2020-01-30 DIAGNOSIS — Z3A27 27 weeks gestation of pregnancy: Secondary | ICD-10-CM | POA: Diagnosis not present

## 2020-01-30 DIAGNOSIS — R197 Diarrhea, unspecified: Secondary | ICD-10-CM | POA: Diagnosis not present

## 2020-01-30 DIAGNOSIS — F419 Anxiety disorder, unspecified: Secondary | ICD-10-CM | POA: Insufficient documentation

## 2020-01-30 DIAGNOSIS — R109 Unspecified abdominal pain: Secondary | ICD-10-CM | POA: Diagnosis not present

## 2020-01-30 DIAGNOSIS — Z87891 Personal history of nicotine dependence: Secondary | ICD-10-CM | POA: Insufficient documentation

## 2020-01-30 DIAGNOSIS — O26892 Other specified pregnancy related conditions, second trimester: Secondary | ICD-10-CM | POA: Insufficient documentation

## 2020-01-30 DIAGNOSIS — O99891 Other specified diseases and conditions complicating pregnancy: Secondary | ICD-10-CM

## 2020-01-30 DIAGNOSIS — O99512 Diseases of the respiratory system complicating pregnancy, second trimester: Secondary | ICD-10-CM | POA: Insufficient documentation

## 2020-01-30 LAB — URINALYSIS, ROUTINE W REFLEX MICROSCOPIC
Bilirubin Urine: NEGATIVE
Glucose, UA: NEGATIVE mg/dL
Hgb urine dipstick: NEGATIVE
Ketones, ur: NEGATIVE mg/dL
Nitrite: NEGATIVE
Protein, ur: NEGATIVE mg/dL
Specific Gravity, Urine: 1.008 (ref 1.005–1.030)
pH: 7 (ref 5.0–8.0)

## 2020-01-30 NOTE — MAU Provider Note (Signed)
History     CSN: [id number]  Arrival date and time: 01/30/20 1532   First Provider Initiated Contact with Patient 01/30/20 1711      Chief Complaint  Patient presents with  . low blood pressure  . Abdominal Pain  . Diarrhea   [name] is a 29 y.o. year old G11P1010 female at [redacted]w[redacted]d weeks gestation who presents to MAU reporting not feeling well and having "low blood pressure," abdominal cramping with diarrhea x 3 episodes (rated 4/10), and nausea today. She reports her BP was 87/46 while laying down today. She called [facility] this morning at 0900 and was advised to eat protein, carbs and increase water intake. She then had a waffle, then 1.5 hrs later a peanut butter sandwich, and then another hour later she had dry saltine crackers. She called [facility] at 1200 reporting she still did not feel well and was advised to come to MAU for evaluation. She denies any contraction like pain, VB or LOF. She reports good (+) FM today.     OB History    Gravida  3   Para  1   Term  1   Preterm      AB  1   Living  0     SAB      TAB  1   Ectopic      Multiple  0   Live Births  1           Past Medical History:  Diagnosis Date  . Abscessed tooth 2016   during pregnancy, had antibiotics  . Anxiety   . Asthma   . Benzodiazepine abuse (HCC) 04/19/2018  . Headache   . Lobar pneumonia (HCC) 10/30/2017  . Sepsis (HCC) 10/30/2017    Past Surgical History:  Procedure Laterality Date  . CESAREAN SECTION N/A 05/24/2015   Procedure: CESAREAN SECTION;  Surgeon: Philip Aspen, DO;  Location: [facility];  Service: Obstetrics;  Laterality: N/A;    Family History  Adopted: Yes  Problem Relation Age of Onset  . Anxiety disorder Mother   . Thyroid disease Mother   . Drug abuse Mother     Social History   Tobacco Use  . Smoking status: Former Smoker    Packs/day: 1.00    Years: 12.00    Pack years: 12.00    Types: Cigarettes    Quit date: 08/25/2019    Years since quitting:  0.4  . Smokeless tobacco: Never Used  Vaping Use  . Vaping Use: Never used  Substance Use Topics  . Alcohol use: Not Currently    Comment: rare  . Drug use: Not Currently    Types: Benzodiazepines    Allergies:  Allergies  Allergen Reactions  . Apple Hives  . Prunus Persica Hives  . Raspberry Hives and Swelling  . Strawberry Extract Hives  . Peanut Oil Itching and Other (See Comments)    Swelling of lips     Medications Prior to Admission  Medication Sig Dispense Refill Last Dose  . escitalopram (LEXAPRO) 10 MG tablet Take 1 tablet (10 mg total) by mouth daily. 90 tablet 1 01/29/2020 at Unknown time  . levocetirizine (XYZAL) 5 MG tablet Take 1 tablet (5 mg total) by mouth every evening. 90 tablet 1 01/29/2020 at Unknown time  . montelukast (SINGULAIR) 10 MG tablet Take 1 tablet (10 mg total) by mouth at bedtime. 90 tablet 1 01/29/2020 at Unknown time  . albuterol (PROVENTIL HFA;VENTOLIN HFA) 108 (90 Base) MCG/ACT inhaler  Inhale 2 puffs into the lungs every 6 (six) hours as needed for wheezing or shortness of breath. 1 Inhaler 0 More than a month at Unknown time  . escitalopram (LEXAPRO) 20 MG tablet TAKE ONE TABLET BY MOUTH DAILY 90 tablet 0   . fluticasone (FLONASE) 50 MCG/ACT nasal spray PLACE 1-2 SPRAYS INTO BOTH NOSTRILS 2 (TWO) TIMES DAILY AS NEEDED FOR ALLERGIES OR RHINITIS. 48 mL 1 More than a month at Unknown time  . ipratropium (ATROVENT) 0.02 % nebulizer solution Take 2.5 mLs (0.5 mg total) by nebulization every 6 (six) hours as needed for wheezing or shortness of breath. 75 mL 0 More than a month at Unknown time    Review of Systems  Constitutional: Positive for appetite change and fatigue.  HENT: Negative.   Eyes: Negative.   Respiratory: Negative.   Cardiovascular: Negative.   Gastrointestinal: Positive for diarrhea and nausea (none since being in MAU). Abdominal pain: cramping.  Endocrine: Negative.   Genitourinary: Negative.   Musculoskeletal: Negative.   Skin:  Negative.   Allergic/Immunologic: Negative.   Neurological: Negative.   Hematological: Negative.   Psychiatric/Behavioral: Negative.    Physical Exam   Patient Vitals for the past 24 hrs:  BP Temp Pulse Resp SpO2 Height Weight  01/30/20 1910 101/63 -- 75 16 -- -- --  01/30/20 1747 102/61 -- 74 -- -- -- --  01/30/20 1607 (!) 102/58 98.6 F (37 C) 78 16 100 % 5\' 6"  (1.676 m) 76.2 kg    Physical Exam Vitals and nursing note reviewed.  Constitutional:      Appearance: She is well-developed and normal weight.  HENT:     Head: Normocephalic and atraumatic.  Cardiovascular:     Rate and Rhythm: Normal rate.  Pulmonary:     Effort: Pulmonary effort is normal.  Abdominal:     Palpations: Abdomen is soft.  Genitourinary:    Comments: Pelvic deferred Skin:    General: Skin is warm and dry.  Neurological:     Mental Status: She is alert and oriented to person, place, and time.  Psychiatric:        Mood and Affect: Mood normal.        Behavior: Behavior normal.     REASSURING NST for gestational age - FHR: 140 bpm / moderate variability / accels present / decels absent / TOCO: none   MAU Course  Procedures  MDM [facility]UA Results for orders placed or performed during the hospital encounter of 01/30/20 (from the past 24 hour(s))  Urinalysis, Routine w reflex microscopic     Status: Abnormal   Collection Time: 01/30/20  5:33 PM  Result Value Ref Range   Color, Urine YELLOW YELLOW   APPearance CLEAR CLEAR   Specific Gravity, Urine 1.008 1.005 - 1.030   pH 7.0 5.0 - 8.0   Glucose, UA NEGATIVE NEGATIVE mg/dL   Hgb urine dipstick NEGATIVE NEGATIVE   Bilirubin Urine NEGATIVE NEGATIVE   Ketones, ur NEGATIVE NEGATIVE mg/dL   Protein, ur NEGATIVE NEGATIVE mg/dL   Nitrite NEGATIVE NEGATIVE   Leukocytes,Ua TRACE (A) NEGATIVE   WBC, UA 6-10 0 - 5 WBC/hpf   Bacteria, UA RARE (A) NONE SEEN   Squamous Epithelial / LPF 0-5 0 - 5   Mucus PRESENT     Assessment and Plan  Abdominal cramps  - Plan: Discharge patient - Advised the abdominal cramping is due to diarrhea and possible dehydration - Advised can take Tylenol 1000 mg every 6 hours prn pain  Diarrhea in adult patient  - Information provided on diarrhea, food choices to help relieve diarrhea - Extensive education on better eating (ie. Increasing protein and hydration) daily - Advised to research vegetarian options for protein and add them to her diet - Information provided on eating plan for pregnant women and protein content in foods    - Discharge patient - Keep scheduled appt with [facility] on 02/06/20 - Patient verbalized an understanding of the plan of care and agrees.     Raelyn Mora, MSN, CNM 01/30/2020, 5:11 PM

## 2020-01-30 NOTE — MAU Note (Signed)
.   Brianna Maddox is a 29 y.o. at [redacted]w[redacted]d here in MAU reporting: low blood pressure today and states she just doesn't feel good. Abdominal cramping with diarrhea x3. Denies any VB or LOF  Onset of complaint: today Pain score: 4 Vitals:   01/30/20 1607  BP: (!) 102/58  Pulse: 78  Resp: 16  Temp: 98.6 F (37 C)  SpO2: 100%     FHT:148 Lab orders placed from triage: UA

## 2020-01-30 NOTE — Discharge Instructions (Signed)
Please try to eat more protein daily and stay well-hydrated by drinking 4-5 of YOUR water bottle or 8-10 plastic water bottles EVERY DAY!

## 2020-03-26 ENCOUNTER — Observation Stay (HOSPITAL_COMMUNITY)
Admission: AD | Admit: 2020-03-26 | Discharge: 2020-03-27 | Disposition: A | Discharge status: Home / Self Care | Payer: Medicaid Other | Attending: Obstetrics and Gynecology | Admitting: Obstetrics and Gynecology

## 2020-03-26 ENCOUNTER — Encounter (HOSPITAL_COMMUNITY): Payer: Self-pay | Admitting: Obstetrics and Gynecology

## 2020-03-26 ENCOUNTER — Other Ambulatory Visit: Payer: Self-pay

## 2020-03-26 DIAGNOSIS — J45909 Unspecified asthma, uncomplicated: Secondary | ICD-10-CM | POA: Diagnosis not present

## 2020-03-26 DIAGNOSIS — Z3A35 35 weeks gestation of pregnancy: Secondary | ICD-10-CM | POA: Diagnosis not present

## 2020-03-26 DIAGNOSIS — Z98891 History of uterine scar from previous surgery: Secondary | ICD-10-CM

## 2020-03-26 DIAGNOSIS — Z20822 Contact with and (suspected) exposure to covid-19: Secondary | ICD-10-CM | POA: Insufficient documentation

## 2020-03-26 DIAGNOSIS — O4703 False labor before 37 completed weeks of gestation, third trimester: Secondary | ICD-10-CM

## 2020-03-26 DIAGNOSIS — Z87891 Personal history of nicotine dependence: Secondary | ICD-10-CM | POA: Diagnosis not present

## 2020-03-26 LAB — WET PREP, GENITAL
Clue Cells Wet Prep HPF POC: NONE SEEN
Sperm: NONE SEEN
Trich, Wet Prep: NONE SEEN
Yeast Wet Prep HPF POC: NONE SEEN

## 2020-03-26 LAB — TYPE AND SCREEN
ABO/RH(D): B POS
Antibody Screen: NEGATIVE

## 2020-03-26 LAB — POCT FERN TEST: POCT Fern Test: NEGATIVE

## 2020-03-26 LAB — RESPIRATORY PANEL BY RT PCR (FLU A&B, COVID)
Influenza A by PCR: NEGATIVE
Influenza B by PCR: NEGATIVE
SARS Coronavirus 2 by RT PCR: NEGATIVE

## 2020-03-26 MED ORDER — OXYCODONE-ACETAMINOPHEN 5-325 MG PO TABS: 1.0000 | ORAL_TABLET | ORAL | Status: DC | PRN

## 2020-03-26 MED ORDER — DIPHENHYDRAMINE HCL 12.5 MG/5ML PO ELIX
12.5000 mg | ORAL_SOLUTION | Freq: Every evening | ORAL | Status: DC | PRN
  Filled 2020-03-26: qty 5

## 2020-03-26 MED ORDER — OXYCODONE-ACETAMINOPHEN 5-325 MG PO TABS: 2.0000 | ORAL_TABLET | ORAL | Status: DC | PRN

## 2020-03-26 MED ORDER — CALCIUM CARBONATE ANTACID 500 MG PO CHEW: 2.0000 | CHEWABLE_TABLET | ORAL | Status: DC | PRN

## 2020-03-26 MED ORDER — ACETAMINOPHEN 500 MG PO TABS
1000.0000 mg | ORAL_TABLET | Freq: Once | ORAL | Status: AC
  Administered 2020-03-26: 1000 mg via ORAL
  Filled 2020-03-26: qty 2

## 2020-03-26 MED ORDER — OXYTOCIN BOLUS FROM INFUSION: 333.0000 mL | Freq: Once | INTRAVENOUS | Status: DC

## 2020-03-26 MED ORDER — ONDANSETRON HCL 4 MG/2ML IJ SOLN: 4.0000 mg | Freq: Four times a day (QID) | INTRAMUSCULAR | Status: DC | PRN

## 2020-03-26 MED ORDER — BETAMETHASONE SOD PHOS & ACET 6 (3-3) MG/ML IJ SUSP
12.0000 mg | Freq: Once | INTRAMUSCULAR | Status: AC
  Administered 2020-03-26: 12 mg via INTRAMUSCULAR
  Filled 2020-03-26: qty 5

## 2020-03-26 MED ORDER — DOCUSATE SODIUM 100 MG PO CAPS: 100.0000 mg | ORAL_CAPSULE | Freq: Every day | ORAL | Status: DC

## 2020-03-26 MED ORDER — ACETAMINOPHEN 325 MG PO TABS: 650.0000 mg | ORAL_TABLET | ORAL | Status: DC | PRN

## 2020-03-26 MED ORDER — FLEET ENEMA 7-19 GM/118ML RE ENEM: 1.0000 | ENEMA | Freq: Every day | RECTAL | Status: DC | PRN

## 2020-03-26 MED ORDER — LACTATED RINGERS IV SOLN: 500.0000 mL | INTRAVENOUS | Status: DC | PRN

## 2020-03-26 MED ORDER — LIDOCAINE HCL (PF) 1 % IJ SOLN: 30.0000 mL | INTRAMUSCULAR | Status: DC | PRN

## 2020-03-26 MED ORDER — SOD CITRATE-CITRIC ACID 500-334 MG/5ML PO SOLN: 30.0000 mL | ORAL | Status: DC | PRN

## 2020-03-26 MED ORDER — LACTATED RINGERS IV SOLN: INTRAVENOUS | Status: DC

## 2020-03-26 MED ORDER — OXYTOCIN-SODIUM CHLORIDE 30-0.9 UT/500ML-% IV SOLN: 2.5000 [IU]/h | INTRAVENOUS | Status: DC

## 2020-03-26 MED ORDER — PRENATAL MULTIVITAMIN CH: 1.0000 | ORAL_TABLET | Freq: Every day | ORAL | Status: DC

## 2020-03-26 MED ORDER — ZOLPIDEM TARTRATE 5 MG PO TABS: 5.0000 mg | ORAL_TABLET | Freq: Every evening | ORAL | Status: DC | PRN

## 2020-03-26 NOTE — MAU Note (Signed)
.   Brianna Maddox is a 29 y.o. at [redacted]w[redacted]d here in MAU reporting: Leaking of clear fluid that started 1445 accompanied by "pressure bulging out of her stomach and little flashes of pain". She reports that she has still had some leaking but it is not dripping down her legs. Her water never broke with her last baby so she states she doesn't know what it feels like. No VB. Endorses good fetal movement. States she also lost some of her mucus plug last week. No recent intercourse.  Pain score: 3 Vitals:   03/26/20 1616  BP: 108/69  Pulse: (!) 112  Resp: 15  Temp: 98.4 F (36.9 C)  SpO2: 100%     FHT:137

## 2020-03-26 NOTE — H&P (Signed)
29 y.o. [redacted]w[redacted]d  G3P1010 comes in c/o leaking fluid and worsening contractions.  Otherwise has good fetal movement and no bleeding.  Past Medical History:  Diagnosis Date  . Abscessed tooth 2016   during pregnancy, had antibiotics  . Anxiety   . Asthma   . Benzodiazepine abuse (HCC) 04/19/2018  . Headache   . Lobar pneumonia (HCC) 10/30/2017  . Sepsis (HCC) 10/30/2017    Past Surgical History:  Procedure Laterality Date  . CESAREAN SECTION N/A 05/24/2015   Procedure: CESAREAN SECTION;  Surgeon: Philip Aspen, DO;  Location: [facility];  Service: Obstetrics;  Laterality: N/A;    OB History  Gravida Para Term Preterm AB Living  3 1 1   1  0  SAB TAB Ectopic Multiple Live Births    1   0 1    # Outcome Date GA Lbr Len/2nd Weight Sex Delivery Anes PTL Lv  3 Current           2 Term 05/24/15 [redacted]w[redacted]d 14:02 / 04:11 4230 g M CS-LTranv EPI  LIV  1 TAB             Social History   Socioeconomic History  . Marital status: Significant Other    Spouse name: Not on file  . Number of children: 1  . Years of education: Not on file  . Highest education level: Not on file  Occupational History  . Not on file  Tobacco Use  . Smoking status: Former Smoker    Packs/day: 1.00    Years: 12.00    Pack years: 12.00    Types: Cigarettes    Quit date: 08/25/2019    Years since quitting: 0.5  . Smokeless tobacco: Never Used  Vaping Use  . Vaping Use: Never used  Substance and Sexual Activity  . Alcohol use: Not Currently    Comment: rare  . Drug use: Not Currently    Types: Benzodiazepines  . Sexual activity: Not Currently    Partners: Male  Other Topics Concern  . Not on file  Social History Narrative  . Not on file   Social Determinants of Health   Financial Resource Strain:   . Difficulty of Paying Living Expenses: Not on file  Food Insecurity:   . Worried About 08/27/2019 in the Last Year: Not on file  . Ran Out of Food in the Last Year: Not on file  Transportation Needs:    . Lack of Transportation (Medical): Not on file  . Lack of Transportation (Non-Medical): Not on file  Physical Activity:   . Days of Exercise per Week: Not on file  . Minutes of Exercise per Session: Not on file  Stress:   . Feeling of Stress : Not on file  Social Connections:   . Frequency of Communication with Friends and Family: Not on file  . Frequency of Social Gatherings with Friends and Family: Not on file  . Attends Religious Services: Not on file  . Active Member of Clubs or Organizations: Not on file  . Attends Programme researcher, broadcasting/film/video Meetings: Not on file  . Marital Status: Not on file  Intimate Partner Violence:   . Fear of Current or Ex-Partner: Not on file  . Emotionally Abused: Not on file  . Physically Abused: Not on file  . Sexually Abused: Not on file   Apple, Prunus persica, Raspberry, Strawberry extract, and Peanut oil    Prenatal Transfer Tool  Maternal Diabetes: No Genetic Screening: Normal Maternal  Ultrasounds/Referrals: Normal and Other: limited views of heart, 2VC and face, referred to MFM, fetal echo performed and deemed normal with trivial tricuspid valve regurg. Fetal Ultrasounds or other Referrals:  Fetal echo, Referred to Materal Fetal Medicine  Maternal Substance Abuse:  Yes:  Type: Smoker Significant Maternal Medications:  None Significant Maternal Lab Results: None  Other PNC: smoker quit 08/2019, prior C/S for arrest of descent, OP presentation/hyperextension with brow presentation per office record prior wt 9#5-9#8, desires TOLAC    Vitals:   03/26/20 1616 03/26/20 1629  BP: 108/69 102/72  Pulse: (!) 112 (!) 102  Resp: 15   Temp: 98.4 F (36.9 C)   SpO2: 100% 97%  Weight: 82.7 kg   Height: 5\' 6"  (1.676 m)     Lungs/Cor:  NAD Abdomen:  soft, gravid Ex:  no cords, erythema SVE: 5/70/-2 FHTs: 130, good STV, NST R; Cat 1 tracing. Toco:  q1-5   A/P   Admit for 23 hour observation for PTL  Discussed with staff, cervical exam  essentially unchanged, cervix very posterior, but pt uncomfortable and worried to go home.  Will admit for 23 hour obs and betamethasone given 35.2 Other routine antenatal care  GBS unknown, rapid PCR ordered  H/O C/S, TOLAC desired  04-17-1973

## 2020-03-26 NOTE — MAU Provider Note (Signed)
S: Ms. Soraya Paquette is a 29 y.o. G3P1010 at [redacted]w[redacted]d  who presents to MAU today complaining of leaking of fluid since 1445. She denies vaginal bleeding. She endorses contractions. She reports normal fetal movement.  She had a previous c-section however desires a vaginal delivery with this pregnancy.   O: BP 102/72   Pulse (!) 102   Temp 98.4 F (36.9 C)   Resp 15   Ht 5\' 6"  (1.676 m)   Wt 82.7 kg   LMP 07/23/2019 (Exact Date)   SpO2 97%   BMI 29.44 kg/m  GENERAL: Well-developed, well-nourished female in no acute distress.  HEAD: Normocephalic, atraumatic.  CHEST: Normal effort of breathing, regular heart rate ABDOMEN: Soft, nontender, gravid PELVIC: Normal external female genitalia. Vagina is pink and rugated. Cervix with normal contour, no lesions. Normal discharge.  Negative pooling.   Cervical exam:  Dilation: 5 Effacement (%): 70 Station: -2 Exam by:: [email or web address], NP Bulging membranes noted  Fetal Monitoring: Baseline: 135 bpm Variability: moderate Accelerations: 15x15 Decelerations: None Contractions: Irregular pattern.   Results for orders placed or performed during the hospital encounter of 03/26/20 (from the past 24 hour(s))  Wet prep, genital     Status: Abnormal   Collection Time: 03/26/20  5:03 PM   Specimen: Vaginal  Result Value Ref Range   Yeast Wet Prep HPF POC NONE SEEN NONE SEEN   Trich, Wet Prep NONE SEEN NONE SEEN   Clue Cells Wet Prep HPF POC NONE SEEN NONE SEEN   WBC, Wet Prep HPF POC MANY (A) NONE SEEN   Sperm NONE SEEN   Fern Test     Status: None   Collection Time: 03/26/20  6:24 PM  Result Value Ref Range   POCT Fern Test Negative = intact amniotic membranes      A:    ICD-10-CM   1. Threatened premature labor in third trimester  O47.03   2. Previous cesarean section  Z98.891   3. [redacted] weeks gestation of pregnancy  Z3A.35     P:  Discussed patient with Dr. 03/28/20, cervix changed from 4 to 5 cm. She lives 25 mins from the  hospital, will admit for observation for threatened preterm labor. Patient is now feeling more pressure in her vagina and rectum.  BMZ dose 1 given.  FERN negative    Ondine Gemme, Claiborne Billings, NP 03/26/2020 6:53 PM

## 2020-03-27 LAB — CBC
HCT: 33.6 % — ABNORMAL LOW (ref 36.0–46.0)
Hemoglobin: 11.1 g/dL — ABNORMAL LOW (ref 12.0–15.0)
MCH: 32.4 pg (ref 26.0–34.0)
MCHC: 33 g/dL (ref 30.0–36.0)
MCV: 98 fL (ref 80.0–100.0)
Platelets: 142 10*3/uL — ABNORMAL LOW (ref 150–400)
RBC: 3.43 MIL/uL — ABNORMAL LOW (ref 3.87–5.11)
RDW: 13 % (ref 11.5–15.5)
WBC: 9.8 10*3/uL (ref 4.0–10.5)
nRBC: 0 % (ref 0.0–0.2)

## 2020-03-27 LAB — GROUP B STREP BY PCR: Group B strep by PCR: NEGATIVE

## 2020-03-27 LAB — RPR: RPR Ser Ql: NONREACTIVE

## 2020-03-27 MED ORDER — MONTELUKAST SODIUM 10 MG PO TABS
10.0000 mg | ORAL_TABLET | Freq: Every day | ORAL | Status: DC
  Filled 2020-03-27: qty 1

## 2020-03-27 MED ORDER — ESCITALOPRAM OXALATE 10 MG PO TABS
10.0000 mg | ORAL_TABLET | Freq: Every day | ORAL | Status: DC
  Administered 2020-03-27: 10 mg via ORAL
  Filled 2020-03-27: qty 1

## 2020-03-27 MED ORDER — ZOLPIDEM TARTRATE 5 MG PO TABS: 5.0000 mg | ORAL_TABLET | Freq: Every evening | ORAL | Status: DC | PRN

## 2020-03-27 MED ORDER — BETAMETHASONE SOD PHOS & ACET 6 (3-3) MG/ML IJ SUSP
12.0000 mg | Freq: Once | INTRAMUSCULAR | Status: AC
  Administered 2020-03-27: 12 mg via INTRAMUSCULAR

## 2020-03-27 NOTE — Progress Notes (Signed)
Unsure of plan of mgmt for this pt. Will ask for MFM consult. If pt is staying in the hospital will transfer to 1st floor. Pt states that she feels no change in freq or intensity of ctxs.

## 2020-03-27 NOTE — Progress Notes (Signed)
Patient is comfortable, feeling mild contractions.  Mild Headache, otherwise no problems.  She reports good FM with regular kicking. FHT: 105 mod var + accels, no decels but low baseline TOCO: irregular SVE: 5/90/-1 A/P: 35.3 with PTL Unfortunately although I confirmed with staff in MAU that GBS rapid PCR was needed, I was just alerted by RN that culture was done.  Since no cervical change and pt is comfortable, will recollect and submit GBS rapid PCR Low baseline, I checked pt myself and obtained scalp stim, with current active FM, overall strip looks very good. Also given no continued cervical change, will just monitor without augmentation and plan for second dose of betamethasone today.

## 2020-03-27 NOTE — Progress Notes (Signed)
Pt just had her second dose of steroids. She has had no change in cervix since admission, She is not having any consistent ctxs. D/W Dr. Grace Bushy. Will send pt home on limited movement and return to office on Friday.

## 2020-03-27 NOTE — Progress Notes (Signed)
Review discharge instructions with patient, follow up care and when to contact Ob or MAU. Patient verbally states she understands instructions. Patient leaving ambulatory, by self.Significant other coming to help her to car.

## 2020-03-27 NOTE — Discharge Instructions (Signed)
Activity Restriction During Pregnancy Your health care provider may recommend specific activity restrictions during pregnancy for a variety of reasons. Activity restriction may require that you limit activities that require great effort, such as exercise, lifting, or sex. The type of activity restriction will vary for each person, depending on your risk or the problems you are having. Activity restriction may be recommended for a period of time until your baby is delivered. Why are activity restrictions recommended? Activity restriction may be recommended if:  Your placenta is partially or completely covering the opening of your cervix (placenta previa).  There is bleeding between the wall of the uterus and the amniotic sac in the first trimester of pregnancy (subchorionic hemorrhage).  You went into labor too early (preterm labor).  You have a history of miscarriage.  You have a condition that causes high blood pressure during pregnancy (preeclampsia or eclampsia).  You are pregnant with more than one baby.  Your baby is not growing well. What are the risks? The risks depend on your specific restriction. Strict bed rest has the most physical and emotional risks and is no longer routinely recommended. Risks of strict bed rest include:  Loss of muscle conditioning from not moving.  Blood clots.  Social isolation.  Depression.  Loss of income. Talk with your health care team about activity restriction to decide if it is best for you and your baby. Even if you are having problems during your pregnancy, you may be able to continue with normal levels of activity with careful monitoring by your health care team. Follow these instructions at home: If needed, based on your overall health and the health of your baby, your health care provider will decide which type of activity restriction is right for you. Activity restrictions may include:  Not lifting anything heavier than 10 pounds (4.5  kg).  Avoiding activities that take a lot of physical effort.  No lifting or straining.  Resting in a sitting position or lying down for periods of time during the day. Pelvic rest may be recommended along with activity restrictions. If pelvic rest is recommended, then:  Do not have sex, an orgasm, or use sexual stimulation.  Do not use tampons. Do not douche. Do not put anything into your vagina.  Do not lift anything that is heavier than 10 lb (4.5 kg).  Avoid activities that require a lot of effort.  Avoid any activity in which your pelvic muscles could become strained, such as squatting. Questions to ask your health care provider  Why is my activity being limited?  How will activity restrictions affect my body?  Why is rest helpful for me and my baby?  What activities can I do?  When can I return to normal activities? When should I seek immediate medical care? Seek immediate medical care if you have:  Vaginal bleeding.  Vaginal discharge.  Cramping pain in your lower abdomen.  Regular contractions.  A low, dull backache. Summary  Your health care provider may recommend specific activity restrictions during pregnancy for a variety of reasons.  Activity restriction may require that you limit activities such as exercise, lifting, sex, or any other activity that requires great effort.  Discuss the risks and benefits of activity restriction with your health care team to decide if it is best for you and your baby.  Contact your health care provider right away if you think you are having contractions, or if you notice vaginal bleeding, discharge, or cramping. This information is not   intended to replace advice given to you by your health care provider. Make sure you discuss any questions you have with your health care provider. Document Revised: 03/09/2019 Document Reviewed: 10/06/2017 Elsevier Patient Education  2020 Else[employer] Inc.   Abdominal Pain During  Pregnancy  Abdominal pain is common during pregnancy, and has many possible causes. Some causes are more serious than others, and sometimes the cause is not known. Abdominal pain can be a sign that labor is starting. It can also be caused by normal growth and stretching of muscles and ligaments during pregnancy. Always tell your health care provider if you have any abdominal pain. Follow these instructions at home:  Do not have sex or put anything in your vagina until your pain goes away completely.  Get plenty of rest until your pain improves.  Drink enough fluid to keep your urine pale yellow.  Take over-the-counter and prescription medicines only as told by your health care provider.  Keep all follow-up visits as told by your health care provider. This is important. Contact a health care provider if:  Your pain continues or gets worse after resting.  You have lower abdominal pain that: ? Comes and goes at regular intervals. ? Spreads to your back. ? Is similar to menstrual cramps.  You have pain or burning when you urinate. Get help right away if:  You have a fever or chills.  You have vaginal bleeding.  You are leaking fluid from your vagina.  You are passing tissue from your vagina.  You have vomiting or diarrhea that lasts for more than 24 hours.  Your baby is moving less than usual.  You feel very weak or faint.  You have shortness of breath.  You develop severe pain in your upper abdomen. Summary  Abdominal pain is common during pregnancy, and has many possible causes.  If you experience abdominal pain during pregnancy, tell your health care provider right away.  Follow your health care provider's home care instructions and keep all follow-up visits as directed. This information is not intended to replace advice given to you by your health care provider. Make sure you discuss any questions you have with your health care provider. Document Revised: 10/04/2018  Document Reviewed: 09/18/2016 Elsevier Patient Education  2020 [employer].

## 2020-03-28 LAB — CULTURE, BETA STREP (GROUP B ONLY)

## 2020-03-29 ENCOUNTER — Other Ambulatory Visit: Payer: Self-pay | Admitting: Adult Health

## 2020-03-29 NOTE — Discharge Summary (Signed)
Pt was a 29 year old white female G3P1011 at 28 5/7 weeks who presented to the ER with ctxs. Pt was 5cm and having mild ctxs. She was admitted for 24 hours obs. She was given betamethasone. She had no change in her cx. Consulted MFM and they agreed that pt could be discharged to home. She will follow up in 3 days in the office. She was given PTL warnings. NST remained reactive

## 2020-04-20 ENCOUNTER — Encounter (HOSPITAL_COMMUNITY): Payer: Self-pay | Admitting: Obstetrics

## 2020-04-20 ENCOUNTER — Inpatient Hospital Stay (HOSPITAL_COMMUNITY): Payer: Medicaid Other | Admitting: Anesthesiology

## 2020-04-20 ENCOUNTER — Inpatient Hospital Stay (HOSPITAL_COMMUNITY)
Admission: AD | Admit: 2020-04-20 | Discharge: 2020-04-21 | DRG: 806 | Disposition: A | Discharge status: Home / Self Care | Payer: Medicaid Other | Attending: Obstetrics | Admitting: Obstetrics

## 2020-04-20 ENCOUNTER — Other Ambulatory Visit: Payer: Self-pay

## 2020-04-20 DIAGNOSIS — O99893 Other specified diseases and conditions complicating puerperium: Secondary | ICD-10-CM | POA: Diagnosis not present

## 2020-04-20 DIAGNOSIS — R42 Dizziness and giddiness: Secondary | ICD-10-CM | POA: Diagnosis not present

## 2020-04-20 DIAGNOSIS — O9912 Other diseases of the blood and blood-forming organs and certain disorders involving the immune mechanism complicating childbirth: Principal | ICD-10-CM | POA: Diagnosis present

## 2020-04-20 DIAGNOSIS — D62 Acute posthemorrhagic anemia: Secondary | ICD-10-CM | POA: Diagnosis not present

## 2020-04-20 DIAGNOSIS — D6959 Other secondary thrombocytopenia: Secondary | ICD-10-CM | POA: Diagnosis present

## 2020-04-20 DIAGNOSIS — R0789 Other chest pain: Secondary | ICD-10-CM | POA: Diagnosis not present

## 2020-04-20 DIAGNOSIS — Z87891 Personal history of nicotine dependence: Secondary | ICD-10-CM | POA: Diagnosis not present

## 2020-04-20 DIAGNOSIS — O34219 Maternal care for unspecified type scar from previous cesarean delivery: Secondary | ICD-10-CM | POA: Diagnosis present

## 2020-04-20 DIAGNOSIS — O9081 Anemia of the puerperium: Secondary | ICD-10-CM | POA: Diagnosis not present

## 2020-04-20 DIAGNOSIS — F419 Anxiety disorder, unspecified: Secondary | ICD-10-CM | POA: Diagnosis present

## 2020-04-20 DIAGNOSIS — Z3A38 38 weeks gestation of pregnancy: Secondary | ICD-10-CM | POA: Diagnosis not present

## 2020-04-20 DIAGNOSIS — O99344 Other mental disorders complicating childbirth: Secondary | ICD-10-CM | POA: Diagnosis present

## 2020-04-20 DIAGNOSIS — I959 Hypotension, unspecified: Secondary | ICD-10-CM | POA: Diagnosis not present

## 2020-04-20 DIAGNOSIS — O26893 Other specified pregnancy related conditions, third trimester: Secondary | ICD-10-CM | POA: Diagnosis present

## 2020-04-20 DIAGNOSIS — Z20822 Contact with and (suspected) exposure to covid-19: Secondary | ICD-10-CM | POA: Diagnosis present

## 2020-04-20 LAB — BASIC METABOLIC PANEL
Anion gap: 8 (ref 5–15)
BUN: 8 mg/dL (ref 6–20)
CO2: 21 mmol/L — ABNORMAL LOW (ref 22–32)
Calcium: 7.8 mg/dL — ABNORMAL LOW (ref 8.9–10.3)
Chloride: 106 mmol/L (ref 98–111)
Creatinine, Ser: 0.75 mg/dL (ref 0.44–1.00)
GFR, Estimated: >60 mL/min (ref >60)
Glucose, Bld: 96 mg/dL (ref 70–99)
Potassium: 4.3 mmol/L (ref 3.5–5.1)
Sodium: 135 mmol/L (ref 135–145)

## 2020-04-20 LAB — CBC
HCT: 35.9 % — ABNORMAL LOW (ref 36.0–46.0)
HCT: 28.6 % — ABNORMAL LOW (ref 36.0–46.0)
Hemoglobin: 11.7 g/dL — ABNORMAL LOW (ref 12.0–15.0)
Hemoglobin: 9.3 g/dL — ABNORMAL LOW (ref 12.0–15.0)
MCH: 31 pg (ref 26.0–34.0)
MCH: 31.5 pg (ref 26.0–34.0)
MCHC: 32.6 g/dL (ref 30.0–36.0)
MCHC: 32.5 g/dL (ref 30.0–36.0)
MCV: 95.2 fL (ref 80.0–100.0)
MCV: 96.9 fL (ref 80.0–100.0)
Platelets: 135 10*3/uL — ABNORMAL LOW (ref 150–400)
Platelets: 119 10*3/uL — ABNORMAL LOW (ref 150–400)
RBC: 3.77 MIL/uL — ABNORMAL LOW (ref 3.87–5.11)
RBC: 2.95 MIL/uL — ABNORMAL LOW (ref 3.87–5.11)
RDW: 13.2 % (ref 11.5–15.5)
RDW: 13.2 % (ref 11.5–15.5)
WBC: 9.2 10*3/uL (ref 4.0–10.5)
WBC: 10.8 10*3/uL — ABNORMAL HIGH (ref 4.0–10.5)
nRBC: 0 % (ref 0.0–0.2)
nRBC: 0 % (ref 0.0–0.2)

## 2020-04-20 LAB — RPR: RPR Ser Ql: NONREACTIVE

## 2020-04-20 LAB — RESPIRATORY PANEL BY RT PCR (FLU A&B, COVID)
Influenza A by PCR: NEGATIVE
Influenza B by PCR: NEGATIVE
SARS Coronavirus 2 by RT PCR: NEGATIVE

## 2020-04-20 LAB — TYPE AND SCREEN
ABO/RH(D): B POS
Antibody Screen: NEGATIVE

## 2020-04-20 LAB — POCT FERN TEST: POCT Fern Test: NEGATIVE

## 2020-04-20 MED ORDER — PHENYLEPHRINE 40 MCG/ML (10ML) SYRINGE FOR IV PUSH (FOR BLOOD PRESSURE SUPPORT)
80.0000 ug | PREFILLED_SYRINGE | INTRAVENOUS | Status: DC | PRN
  Filled 2020-04-20: qty 10

## 2020-04-20 MED ORDER — SIMETHICONE 80 MG PO CHEW: 80.0000 mg | CHEWABLE_TABLET | ORAL | Status: DC | PRN

## 2020-04-20 MED ORDER — SODIUM CHLORIDE 0.9% FLUSH: 3.0000 mL | Freq: Two times a day (BID) | INTRAVENOUS | Status: DC

## 2020-04-20 MED ORDER — COCONUT OIL OIL: 1.0000 "application " | TOPICAL_OIL | Status: DC | PRN

## 2020-04-20 MED ORDER — OXYTOCIN-SODIUM CHLORIDE 30-0.9 UT/500ML-% IV SOLN: 2.5000 [IU]/h | INTRAVENOUS | Status: DC

## 2020-04-20 MED ORDER — ONDANSETRON HCL 4 MG/2ML IJ SOLN
4.0000 mg | Freq: Four times a day (QID) | INTRAMUSCULAR | Status: DC | PRN
  Administered 2020-04-20: 4 mg via INTRAVENOUS
  Filled 2020-04-20: qty 2

## 2020-04-20 MED ORDER — SENNOSIDES-DOCUSATE SODIUM 8.6-50 MG PO TABS
2.0000 | ORAL_TABLET | ORAL | Status: DC
  Filled 2020-04-20: qty 2

## 2020-04-20 MED ORDER — LACTATED RINGERS IV SOLN
500.0000 mL | Freq: Once | INTRAVENOUS | Status: AC
  Administered 2020-04-20: 500 mL via INTRAVENOUS

## 2020-04-20 MED ORDER — ESCITALOPRAM OXALATE 10 MG PO TABS: 10.0000 mg | ORAL_TABLET | Freq: Every day | ORAL | Status: DC

## 2020-04-20 MED ORDER — ONDANSETRON HCL 4 MG PO TABS: 4.0000 mg | ORAL_TABLET | ORAL | Status: DC | PRN

## 2020-04-20 MED ORDER — OXYCODONE HCL 5 MG PO TABS: 5.0000 mg | ORAL_TABLET | ORAL | Status: DC | PRN

## 2020-04-20 MED ORDER — BENZOCAINE-MENTHOL 20-0.5 % EX AERO
1.0000 "application " | INHALATION_SPRAY | CUTANEOUS | Status: DC | PRN
  Administered 2020-04-21: 1 via TOPICAL
  Filled 2020-04-20 (×2): qty 56

## 2020-04-20 MED ORDER — ONDANSETRON HCL 4 MG/2ML IJ SOLN: 4.0000 mg | INTRAMUSCULAR | Status: DC | PRN

## 2020-04-20 MED ORDER — ACETAMINOPHEN 325 MG PO TABS
650.0000 mg | ORAL_TABLET | ORAL | Status: DC | PRN
  Administered 2020-04-20 – 2020-04-21 (×2): 650 mg via ORAL
  Filled 2020-04-20 (×2): qty 2

## 2020-04-20 MED ORDER — WITCH HAZEL-GLYCERIN EX PADS: 1.0000 "application " | MEDICATED_PAD | CUTANEOUS | Status: DC | PRN

## 2020-04-20 MED ORDER — OXYCODONE-ACETAMINOPHEN 5-325 MG PO TABS: 2.0000 | ORAL_TABLET | ORAL | Status: DC | PRN

## 2020-04-20 MED ORDER — FENTANYL-BUPIVACAINE-NACL 0.5-0.125-0.9 MG/250ML-% EP SOLN
12.0000 mL/h | EPIDURAL | Status: DC | PRN
  Filled 2020-04-20: qty 250

## 2020-04-20 MED ORDER — SODIUM CHLORIDE 0.9 % IV SOLN: 250.0000 mL | INTRAVENOUS | Status: DC | PRN

## 2020-04-20 MED ORDER — LIDOCAINE HCL (PF) 1 % IJ SOLN: 30.0000 mL | INTRAMUSCULAR | Status: DC | PRN

## 2020-04-20 MED ORDER — SOD CITRATE-CITRIC ACID 500-334 MG/5ML PO SOLN: 30.0000 mL | ORAL | Status: DC | PRN

## 2020-04-20 MED ORDER — OXYTOCIN BOLUS FROM INFUSION
333.0000 mL | Freq: Once | INTRAVENOUS | Status: AC
  Administered 2020-04-20: 333 mL via INTRAVENOUS

## 2020-04-20 MED ORDER — ACETAMINOPHEN 325 MG PO TABS: 650.0000 mg | ORAL_TABLET | ORAL | Status: DC | PRN

## 2020-04-20 MED ORDER — AMMONIA AROMATIC IN INHA
RESPIRATORY_TRACT | Status: AC
  Filled 2020-04-20: qty 10

## 2020-04-20 MED ORDER — PHENYLEPHRINE 40 MCG/ML (10ML) SYRINGE FOR IV PUSH (FOR BLOOD PRESSURE SUPPORT)
80.0000 ug | PREFILLED_SYRINGE | INTRAVENOUS | Status: DC | PRN
  Administered 2020-04-20: 80 ug via INTRAVENOUS

## 2020-04-20 MED ORDER — ESCITALOPRAM OXALATE 10 MG PO TABS
10.0000 mg | ORAL_TABLET | Freq: Every day | ORAL | Status: DC
  Administered 2020-04-20: 10 mg via ORAL
  Filled 2020-04-20: qty 1

## 2020-04-20 MED ORDER — LIDOCAINE-EPINEPHRINE (PF) 2 %-1:200000 IJ SOLN
INTRAMUSCULAR | Status: DC | PRN
  Administered 2020-04-20: 6 mL via EPIDURAL

## 2020-04-20 MED ORDER — OXYTOCIN-SODIUM CHLORIDE 30-0.9 UT/500ML-% IV SOLN
INTRAVENOUS | Status: AC
  Filled 2020-04-20: qty 500

## 2020-04-20 MED ORDER — SODIUM CHLORIDE 0.9% FLUSH: 3.0000 mL | INTRAVENOUS | Status: DC | PRN

## 2020-04-20 MED ORDER — BISACODYL 10 MG RE SUPP: 10.0000 mg | Freq: Every day | RECTAL | Status: DC | PRN

## 2020-04-20 MED ORDER — DIPHENHYDRAMINE HCL 50 MG/ML IJ SOLN: 12.5000 mg | INTRAMUSCULAR | Status: DC | PRN

## 2020-04-20 MED ORDER — DIPHENHYDRAMINE HCL 25 MG PO CAPS: 25.0000 mg | ORAL_CAPSULE | Freq: Four times a day (QID) | ORAL | Status: DC | PRN

## 2020-04-20 MED ORDER — PNEUMOCOCCAL VAC POLYVALENT 25 MCG/0.5ML IJ INJ: 0.5000 mL | INJECTION | INTRAMUSCULAR | Status: DC

## 2020-04-20 MED ORDER — EPHEDRINE 5 MG/ML INJ
10.0000 mg | INTRAVENOUS | Status: AC | PRN
  Administered 2020-04-20 (×2): 10 mg via INTRAVENOUS

## 2020-04-20 MED ORDER — FENTANYL CITRATE (PF) 2500 MCG/50ML IJ SOLN
INTRAMUSCULAR | Status: DC | PRN
Start: 2020-04-20 — End: 2020-04-20
  Administered 2020-04-20: 12 mL/h via EPIDURAL

## 2020-04-20 MED ORDER — LACTATED RINGERS IV SOLN
500.0000 mL | INTRAVENOUS | Status: DC | PRN
  Administered 2020-04-20: 1000 mL via INTRAVENOUS

## 2020-04-20 MED ORDER — DIBUCAINE (PERIANAL) 1 % EX OINT: 1.0000 "application " | TOPICAL_OINTMENT | CUTANEOUS | Status: DC | PRN

## 2020-04-20 MED ORDER — EPHEDRINE 5 MG/ML INJ
10.0000 mg | INTRAVENOUS | Status: DC | PRN
  Filled 2020-04-20: qty 10

## 2020-04-20 MED ORDER — OXYCODONE-ACETAMINOPHEN 5-325 MG PO TABS: 1.0000 | ORAL_TABLET | ORAL | Status: DC | PRN

## 2020-04-20 MED ORDER — OXYCODONE HCL 5 MG PO TABS: 10.0000 mg | ORAL_TABLET | ORAL | Status: DC | PRN

## 2020-04-20 MED ORDER — IBUPROFEN 600 MG PO TABS
600.0000 mg | ORAL_TABLET | Freq: Four times a day (QID) | ORAL | Status: DC
  Administered 2020-04-20 – 2020-04-21 (×5): 600 mg via ORAL
  Filled 2020-04-20 (×5): qty 1

## 2020-04-20 MED ORDER — FENTANYL CITRATE (PF) 100 MCG/2ML IJ SOLN
50.0000 ug | INTRAMUSCULAR | Status: DC | PRN
  Administered 2020-04-20: 50 ug via INTRAVENOUS
  Filled 2020-04-20: qty 2

## 2020-04-20 MED ORDER — LACTATED RINGERS IV SOLN: INTRAVENOUS | Status: DC

## 2020-04-20 MED ORDER — FLEET ENEMA 7-19 GM/118ML RE ENEM: 1.0000 | ENEMA | Freq: Every day | RECTAL | Status: DC | PRN

## 2020-04-20 MED ORDER — PRENATAL MULTIVITAMIN CH
1.0000 | ORAL_TABLET | Freq: Every day | ORAL | Status: DC
  Administered 2020-04-21: 1 via ORAL
  Filled 2020-04-20: qty 1

## 2020-04-20 NOTE — Anesthesia Preprocedure Evaluation (Addendum)
Anesthesia Evaluation  Patient identified by MRN, date of birth, ID band Patient awake    Reviewed: Allergy & Precautions, NPO status , Patient's Chart, lab work & pertinent test results  Airway Mallampati: II  TM Distance: >3 FB Neck ROM: Full    Dental no notable dental hx.    Pulmonary asthma , former smoker,    Pulmonary exam normal breath sounds clear to auscultation       Cardiovascular negative cardio ROS Normal cardiovascular exam Rhythm:Regular Rate:Normal     Neuro/Psych  Headaches, PSYCHIATRIC DISORDERS Anxiety Depression    GI/Hepatic negative GI ROS, (+)     substance abuse (h/o benzodiazepine abuse)  ,   Endo/Other  negative endocrine ROS  Renal/GU negative Renal ROS  negative genitourinary   Musculoskeletal negative musculoskeletal ROS (+)   Abdominal   Peds  Hematology negative hematology ROS (+)   Anesthesia Other Findings TOLAC  Reproductive/Obstetrics (+) Pregnancy                            Anesthesia Physical Anesthesia Plan  ASA: II  Anesthesia Plan: Epidural   Post-op Pain Management:    Induction:   PONV Risk Score and Plan: Treatment may vary due to age or medical condition  Airway Management Planned: Natural Airway  Additional Equipment:   Intra-op Plan:   Post-operative Plan:   Informed Consent: I have reviewed the patients History and Physical, chart, labs and discussed the procedure including the risks, benefits and alternatives for the proposed anesthesia with the patient or authorized representative who has indicated his/her understanding and acceptance.       Plan Discussed with: Anesthesiologist  Anesthesia Plan Comments: (Patient identified. Risks, benefits, options discussed with patient including but not limited to bleeding, infection, nerve damage, paralysis, failed block, incomplete pain control, headache, blood pressure changes,  nausea, vomiting, reactions to medication, itching, and post partum back pain. Confirmed with bedside nurse the patient's most recent platelet count. Confirmed with the patient that they are not taking any anticoagulation, have any bleeding history or any family history of bleeding disorders. Patient expressed understanding and wishes to proceed. All questions were answered. )        Anesthesia Quick Evaluation

## 2020-04-20 NOTE — Progress Notes (Signed)
Patient seen and examined.  Comfortable with epidural  BP 101/60   Pulse 70   Temp 98.8 F (37.1 C) (Oral)   Resp 17   LMP 07/23/2019 (Exact Date)   SpO2 100%   Toco: q2-3 minutes EFM: 120s, moderate variability, category 1 SVE: 9.5/100/0, bbow  A/P: G2P1 @ [redacted]w[redacted]d w labor / tolac Declines amniotomy at this time--desires to await arrival of husband if fetal status permits.  Deferred per her request at this time

## 2020-04-20 NOTE — Anesthesia Procedure Notes (Signed)
Epidural Patient location during procedure: OB Start time: 04/20/2020 5:45 AM End time: 04/20/2020 5:55 AM  Staffing Anesthesiologist: Elmer Picker, MD Performed: anesthesiologist   Preanesthetic Checklist Completed: patient identified, IV checked, risks and benefits discussed, monitors and equipment checked, pre-op evaluation and timeout performed  Epidural Patient position: sitting Prep: DuraPrep and site prepped and draped Patient monitoring: continuous pulse ox, blood pressure, heart rate and cardiac monitor Approach: midline Location: L3-L4 Injection technique: LOR air  Needle:  Needle type: Tuohy  Needle gauge: 17 G Needle length: 9 cm Needle insertion depth: 4 cm Catheter type: closed end flexible Catheter size: 19 Gauge Catheter at skin depth: 10 cm Test dose: negative  Assessment Sensory level: T8 Events: blood not aspirated, injection not painful, no injection resistance, no paresthesia and negative IV test  Additional Notes Patient identified. Risks/Benefits/Options discussed with patient including but not limited to bleeding, infection, nerve damage, paralysis, failed block, incomplete pain control, headache, blood pressure changes, nausea, vomiting, reactions to medication both or allergic, itching and postpartum back pain. Confirmed with bedside nurse the patient's most recent platelet count. Confirmed with patient that they are not currently taking any anticoagulation, have any bleeding history or any family history of bleeding disorders. Patient expressed understanding and wished to proceed. All questions were answered. Sterile technique was used throughout the entire procedure. Please see nursing notes for vital signs. Test dose was given through epidural catheter and negative prior to continuing to dose epidural or start infusion. Warning signs of high block given to the patient including shortness of breath, tingling/numbness in hands, complete motor block,  or any concerning symptoms with instructions to call for help. Patient was given instructions on fall risk and not to get out of bed. All questions and concerns addressed with instructions to call with any issues or inadequate analgesia.  Reason for block:procedure for pain

## 2020-04-20 NOTE — MAU Note (Signed)
.  Yulianna Folse is a 29 y.o. at [redacted]w[redacted]d here in MAU reporting: vaginal bleeding and a "pop" of fluids that occurred at 0400. Patient reports CTX have been occurring since yesterday. The patient reports she had her membranes stripped yesterday at [facility] and that she was 6cm. Upon MAU cervical exam the patient was 8.5  90  0.   Initial FHR - 125  Marlow Baars, MD, notified at [phone]. Notified provider of FHR, vaginal exam, and CTX pattern. Provider placing orders for admission.  L&D Charge Nurse notified at [phone]. Benedict Needy, RN, notified and report given at [phone].  IV placed and COVID swabbed obtained without difficulty.  Wynelle Bourgeois, CNM, and [facility], RN, transported patient to L&D at [phone].

## 2020-04-20 NOTE — Social Work (Signed)
MOB was referred for history of depression/anxiety.   * Referral screened out by Clinical Social Worker because none of the following criteria appear to apply:  ~ History of anxiety/depression during this pregnancy, or of post-partum depression following prior delivery. ~ Diagnosis of anxiety and/or depression within last 3 years OR * MOB's symptoms currently being treated with medication and/or therapy. Per chart review/H&P, patient is on escitalopram.  Please contact the Clinical Social Worker if needs arise, by [facility] request, or if MOB scores greater than 9/yes to question 10 on Edinburgh Postpartum Depression Screen.  Manfred Arch, LCSWA Clinical Social Work [employer] and [employer]  [phone]

## 2020-04-20 NOTE — H&P (Signed)
29 y.o. G3P1011 @ [redacted]w[redacted]d presents with complaints of leakage of fluid and contractions.  She was ruled out for rupture of membranes but found to be in labor.  Otherwise has good fetal movement and no bleeding.  Pregnancy c/b: 1.  History of cesarean section: For arrest of descent, OP presentation.  Desires TOLAC 2. Admission 9/27-9/28 for threatened preterm labor.  Received BMZ.  3. History of anxiety: on escitalopram   Past Medical History:  Diagnosis Date  . Abscessed tooth 2016   during pregnancy, had antibiotics  . Anxiety   . Asthma   . Benzodiazepine abuse (HCC) 04/19/2018  . Headache   . Lobar pneumonia (HCC) 10/30/2017  . Sepsis (HCC) 10/30/2017    Past Surgical History:  Procedure Laterality Date  . CESAREAN SECTION N/A 05/24/2015   Procedure: CESAREAN SECTION;  Surgeon: Philip Aspen, DO;  Location: [facility];  Service: Obstetrics;  Laterality: N/A;    OB History  Gravida Para Term Preterm AB Living  3 1 1   1 1   SAB TAB Ectopic Multiple Live Births    1   0 1    # Outcome Date GA Lbr Len/2nd Weight Sex Delivery Anes PTL Lv  3 Current           2 Term 05/24/15 [redacted]w[redacted]d 14:02 / 04:11 4230 g M CS-LTranv EPI  LIV  1 TAB             Social History   Socioeconomic History  . Marital status: Significant Other    Spouse name: Not on file  . Number of children: 1  . Years of education: Not on file  . Highest education level: Not on file  Occupational History  . Not on file  Tobacco Use  . Smoking status: Former Smoker    Packs/day: 1.00    Years: 12.00    Pack years: 12.00    Types: Cigarettes    Quit date: 08/25/2019    Years since quitting: 0.6  . Smokeless tobacco: Never Used  Vaping Use  . Vaping Use: Never used  Substance and Sexual Activity  . Alcohol use: Not Currently    Comment: rare  . Drug use: Not Currently    Types: Benzodiazepines  . Sexual activity: Not Currently    Partners: Male  Other Topics Concern  . Not on file  Social History Narrative   . Not on file   Social Determinants of Health      Patient has no known allergies.    Prenatal Transfer Tool  Maternal Diabetes: No Genetic Screening: Normal Maternal Ultrasounds/Referrals: Normal Fetal Ultrasounds or other Referrals:  Fetal echo--poorly visualized outflow tracts,  Fetal ECHO was normal Maternal Substance Abuse:  No Significant Maternal Medications:  Meds include: Other:  escitalopram Significant Maternal Lab Results: None  ABO, Rh: --/--/B POS (09/27 1928) Antibody: NEG (09/27 1928) Rubella:  Immune RPR: NON REACTIVE (09/27 1922)  HBsAg:   Neg HIV:   Neg GBS: NEGATIVE/-- (09/28 0345)       Vitals:   04/20/20 0438  BP: 120/78  Pulse: 88  Resp: 19  SpO2: 99%     General:  NAD Abdomen:  soft, gravid, EFW 8.5-9# Ex:  trace edema SVE:  9.5/100 per RN FHTs:  120s, moderate variability, + accels Toco:  q2-3 minutes  Growth 01-28-1969: 04/04/2020--EFW 7lb 11oz (84%, AC>97%), ,fluid normal   A/P   29 y.o. 26 [redacted]w[redacted]d presents with labor Admit to L&D Epidural now, then amniotomy History of  cesarean section.  Desires TOLAC.  We have discussed the risks vs benefits of trial of labor vs repeat cesarean section, including risk of uterine rupture.  She desires to proceed with trial of labor FSR/ vtx/ GBS negative  Brianna Maddox Brianna Maddox Brianna Maddox

## 2020-04-20 NOTE — Progress Notes (Addendum)
OBGYN Note Patient comfortable with epidural Vitals:   04/20/20 0620 04/20/20 0625 04/20/20 0630 04/20/20 0702  BP: (!) 94/49 (!) 94/48 111/68 (!) 94/52  Pulse: 76 71 66 74  Resp:   16 17  Temp:      TempSrc:      SpO2: 100% 100% 100%   Height:  5\' 6"  (1.676 m)     FHR 135, Cat 1 Toco q1-27m SVE 9.5/100/+1, AROM c/f. LOP on 01-28-1969.  Brianna Maddox 29 y.o. 26 at [redacted]w[redacted]d here with labor/TOLAC -Discussed VAVD, includes risks/benefits/alternatives, patient amenable if necessary -Augment with pitocin prn [name] 04/20/20 7:55 AM

## 2020-04-21 ENCOUNTER — Other Ambulatory Visit: Payer: Self-pay | Admitting: Adult Health

## 2020-04-21 DIAGNOSIS — D62 Acute posthemorrhagic anemia: Secondary | ICD-10-CM

## 2020-04-21 LAB — CBC
HCT: 23 % — ABNORMAL LOW (ref 36.0–46.0)
Hemoglobin: 7.3 g/dL — ABNORMAL LOW (ref 12.0–15.0)
MCH: 30.8 pg (ref 26.0–34.0)
MCHC: 31.7 g/dL (ref 30.0–36.0)
MCV: 97 fL (ref 80.0–100.0)
Platelets: 114 10*3/uL — ABNORMAL LOW (ref 150–400)
RBC: 2.37 MIL/uL — ABNORMAL LOW (ref 3.87–5.11)
RDW: 13.5 % (ref 11.5–15.5)
WBC: 10.7 10*3/uL — ABNORMAL HIGH (ref 4.0–10.5)
nRBC: 0 % (ref 0.0–0.2)

## 2020-04-21 MED ORDER — SENNOSIDES-DOCUSATE SODIUM 8.6-50 MG PO TABS
2.0000 | ORAL_TABLET | ORAL | 1 refills | Status: DC
Start: 2020-04-22 — End: 2022-01-03

## 2020-04-21 MED ORDER — FERROUS SULFATE 325 (65 FE) MG PO TABS: 325.0000 mg | ORAL_TABLET | Freq: Every day | ORAL | 11 refills | Status: DC

## 2020-04-21 MED ORDER — IBUPROFEN 800 MG PO TABS: 800.0000 mg | ORAL_TABLET | Freq: Three times a day (TID) | ORAL | 0 refills | Status: DC | PRN

## 2020-04-21 NOTE — Discharge Summary (Signed)
Postpartum Discharge Summary  Patient Name: Brianna Maddox DOB: 23-Jul-1990 MRN: [id number]  Date of admission: 04/20/2020 Delivery date:04/20/2020  Delivering provider: Lyda Kalata K  Date of discharge: 04/22/2020  Admitting diagnosis: Normal labor [O80, Z37.9] Intrauterine pregnancy: [redacted]w[redacted]d    Secondary diagnosis:  Active Problems:   Normal labor   Acute blood loss anemia  Additional problems: Gestational thrombocytopenia, Anxiety    Discharge diagnosis: Term Pregnancy Delivered and VBAC                                              Post partum procedures:n/a Augmentation: AROM Complications: None  Hospital course: Onset of Labor With Vaginal Delivery      29y.o. yo [id number] 325w6das admitted in Active Labor on 04/20/2020. Patient had an uncomplicated labor course as follows:  Membrane Rupture Time/Date: 7:43 AM ,04/20/2020   Delivery Method:VBAC, Spontaneous  Episiotomy: None  Lacerations:  2nd degree;Periurethral  On L&D postpartum patient had hypotensive episodes with lightheadedness. Reported similar episodes after her CS. Improved with IVF. On PPD#1 patient was feeling better, in the AM reported right sided chest pain when moving from sitting to standing, this resolved by the afternoon. She had ABLA with hgb 11.7>7.3 otherwise normal CBC/BMP/EKG. Patient otherwise had uncomplicated postpartum course.  She is ambulating, tolerating a regular diet, passing flatus, and urinating well. Patient is discharged home in stable condition on 04/22/20.  Newborn Data: Birth date:04/20/2020  Birth time:9:46 AM  Gender:Female  Living status:Living  Apgars:6 ,8  Weight:4915 g   Magnesium Sulfate received: No BMZ received: No Rhophylac:N/A MMR:N/A T-DaP:Given prenatally Flu: given prenatally  Transfusion:No  Physical exam  Vitals:   04/20/20 1555 04/20/20 2000 04/20/20 2338 04/21/20 0415  BP: 107/65 109/65 (!) 96/53 107/62  Pulse: (!) 103 76 80 74  Resp: 18  18 16 17   Temp: 98.4 F (36.9 C) 98.3 F (36.8 C) 98.7 F (37.1 C) 98.6 F (37 C)  TempSrc: Oral Oral Oral Oral  SpO2: 99% 100% 100% 100%  Height:       General: alert Lochia: appropriate Uterine Fundus: firm DVT Evaluation: No evidence of DVT seen on physical exam. Labs: Lab Results  Component Value Date   WBC 10.7 (H) 04/21/2020   HGB 7.3 (L) 04/21/2020   HCT 23.0 (L) 04/21/2020   MCV 97.0 04/21/2020   PLT 114 (L) 04/21/2020   CMP Latest Ref Rng & Units 04/20/2020  Glucose 70 - 99 mg/dL 96  BUN 6 - 20 mg/dL 8  Creatinine 0.44 - 1.00 mg/dL 0.75  Sodium 135 - 145 mmol/L 135  Potassium 3.5 - 5.1 mmol/L 4.3  Chloride 98 - 111 mmol/L 106  CO2 22 - 32 mmol/L 21(L)  Calcium 8.9 - 10.3 mg/dL 7.8(L)  Total Protein 6.1 - 8.1 g/dL -  Total Bilirubin 0.2 - 1.2 mg/dL -  Alkaline Phos 38 - 126 U/L -  AST 10 - 30 U/L -  ALT 6 - 29 U/L -   Edinburgh Score: Edinburgh Postnatal Depression Scale Screening Tool 04/21/2020  I have been able to laugh and see the funny side of things. 0  I have looked forward with enjoyment to things. 0  I have blamed myself unnecessarily when things went wrong. 1  I have been anxious or worried for no good reason. 2  I have felt scared or panicky  for no good reason. 1  Things have been getting on top of me. 0  I have been so unhappy that I have had difficulty sleeping. 0  I have felt sad or miserable. 0  I have been so unhappy that I have been crying. 0  The thought of harming myself has occurred to me. 0  Edinburgh Postnatal Depression Scale Total 4      After visit meds:  Allergies as of 04/21/2020   No Known Allergies     Medication List    TAKE these medications   calcium carbonate 500 MG chewable tablet Commonly known as: TUMS - dosed in mg elemental calcium Chew 1,000 mg by mouth 3 (three) times daily as needed for indigestion or heartburn.   escitalopram 10 MG tablet Commonly known as: LEXAPRO Take 1 tablet (10 mg total) by  mouth daily.   ferrous sulfate 325 (65 FE) MG tablet Commonly known as: FerrouSul Take 1 tablet (325 mg total) by mouth daily with breakfast.   ibuprofen 800 MG tablet Commonly known as: ADVIL Take 1 tablet (800 mg total) by mouth every 8 (eight) hours as needed.   levocetirizine 5 MG tablet Commonly known as: XYZAL Take      1 tablet      Daily       for Allergies   prenatal multivitamin Tabs tablet Take 1 tablet by mouth daily at 12 noon.   senna-docusate 8.6-50 MG tablet Commonly known as: Senokot-S Take 2 tablets by mouth daily.        Discharge home in stable condition Infant Feeding: Breast Infant Disposition:home with mother Discharge instruction: per After Visit Summary and Postpartum booklet. Activity: Advance as tolerated. Pelvic rest for 6 weeks.  Diet: routine diet Anticipated Birth Control: Unsure Postpartum Appointment:4 weeks Additional Postpartum F/U: CBC for gestational thrombocytopenia Future Appointments:No future appointments. Follow up Visit:  Follow-up Information    Taam-Akelman, Lawrence Santiago, MD. Schedule an appointment as soon as possible for a visit in 4 week(s).   Specialty: Obstetrics and Gynecology Contact information: [place] [place] [address] [phone]                   04/22/2020 Jonelle Sidle, MD

## 2020-04-21 NOTE — Lactation Note (Signed)
This note was copied from a baby's chart. Lactation Consultation Note  Patient Name: Brianna Maddox POEUM'P Date: 04/21/2020 Reason for consult: Initial assessment;Early term 72-38.6wks Baby 23hrs old, wt loss 4.07%, serum bili 5.5 at 21hrs of life. Mom resting in bed holding sleeping baby, dad sitting in chair. Mom reports concerns with a shallow latch, notes pain with breastfeeding. Compression stripe noted to left nipple, right nipple without damage. Mom reports difficulty hand expressing left breast. Mom has a 5yo who breast fed x44mo then discontinued d/t "milk drying up". Mom would like to exclusively breastfeed current baby as long as possible. Taught hand expression, mom easily expressed colostrum from breasts bilat, reinforced technique and hand placement. Mom up to couch, assisted with latching baby to left breast football hold, LC broke latch and mom successfully latched deeply without assistance, denies pain with latch (states with discomfort from previous trauma). Mom discontinued feeding, nipple round on release. Mom latched to right breast football hold without assistance, denies pain, breast tissue movement and audible swallows noted. Mom questioned if Lexapro safe to take while breastfeeding, advised it is an L2 med and probably compatible, advised to monitor baby for irritability, poor feeding, difficulty to arouse, notify peds and Ob if noted. Comfort gels given with instructions. Discussed cue based feedings, wake if >3hrs since last feeding, 8-12 in 24hrs, hand express after q feed and offer back to baby, skin to skin, cluster feeding, laid back BF as alternative position option, engorgement and how to manage (mom has DEBP at home), avoid pacifier x45mo, [facility] BF brochure with support groups and numbers for LC support. Left the room with mom still nursing ~18min mark. BGilliam, RN, IBCLC  Plan - feed baby on cue, wake if >3hrs since last feeding - hand express after each feed and  offer colostrum back to baby - monitor wet/ stool diapers for adequate intake - EBM to nipples after feeds, air dry and Comfort Gels as needed - call for LC support if with difficult latch or additional feeding concerns    Maternal Data Formula Feeding for Exclusion: No Has patient been taught Hand Expression?: Yes Does the patient have breastfeeding experience prior to this delivery?: Yes  Feeding Feeding Type: Breast Fed  LATCH Score Latch: Grasps breast easily, tongue down, lips flanged, rhythmical sucking.  Audible Swallowing: Spontaneous and intermittent  Type of Nipple: Everted at rest and after stimulation  Comfort (Breast/Nipple): Filling, red/small blisters or bruises, mild/mod discomfort (compression stripe to left nipple)  Hold (Positioning): Assistance needed to correctly position infant at breast and maintain latch.  LATCH Score: 8  Interventions Interventions: Breast feeding basics reviewed;Assisted with latch;Skin to skin;Breast massage;Hand express;Breast compression;Adjust position;Support pillows;Position options;Expressed milk;Comfort gels  Lactation Tools Discussed/Used WIC Program: No   Consult Status Consult Status: Follow-up Date: 04/22/20 Follow-up type: In-patient    Charlynn Court 04/21/2020, 9:20 AM

## 2020-04-21 NOTE — Discharge Instructions (Signed)
Prescriptions Motrin 800mg every 8 hours for pain Acetaminophen 650mg every 6 hours for moderate pain Percocet (Oxycodone 5mg-acetaminophen 325mg) every 4 hours for severe pain.  Make sure to not exceed acetaminophen 3000mg every day.  Postpartum Care After Vaginal Delivery This sheet gives you information about how to care for yourself from the time you deliver your baby to up to 6-12 weeks after delivery (postpartum period). Your health care provider may also give you more specific instructions. If you have problems or questions, contact your health care provider. Follow these instructions at home: Vaginal bleeding  It is normal to have vaginal bleeding (lochia) after delivery. Wear a sanitary pad for vaginal bleeding and discharge. ? During the first week after delivery, the amount and appearance of lochia is often similar to a menstrual period. ? Over the next few weeks, it will gradually decrease to a dry, yellow-brown discharge. ? For most women, lochia stops completely by 4-6 weeks after delivery. Vaginal bleeding can vary from woman to woman.  Change your sanitary pads frequently. Watch for any changes in your flow, such as: ? A sudden increase in volume. ? A change in color. ? Large blood clots.  If you pass a blood clot from your vagina, save it and call your health care provider to discuss. Do not flush blood clots down the toilet before talking with your health care provider.  Do not use tampons or douches until your health care provider says this is safe.  If you are not breastfeeding, your period should return 6-8 weeks after delivery. If you are feeding your child breast milk only (exclusive breastfeeding), your period may not return until you stop breastfeeding. Perineal care  Keep the area between the vagina and the anus (perineum) clean and dry as told by your health care provider. Use medicated pads and pain-relieving sprays and creams as directed.  If you had a cut  in the perineum (episiotomy) or a tear in the vagina, check the area for signs of infection until you are healed. Check for: ? More redness, swelling, or pain. ? Fluid or blood coming from the cut or tear. ? Warmth. ? Pus or a bad smell.  You may be given a squirt bottle to use instead of wiping to clean the perineum area after you go to the bathroom. As you start healing, you may use the squirt bottle before wiping yourself. Make sure to wipe gently.  To relieve pain caused by an episiotomy, a tear in the vagina, or swollen veins in the anus (hemorrhoids), try taking a warm sitz bath 2-3 times a day. A sitz bath is a warm water bath that is taken while you are sitting down. The water should only come up to your hips and should cover your buttocks. Breast care  Within the first few days after delivery, your breasts may feel heavy, full, and uncomfortable (breast engorgement). Milk may also leak from your breasts. Your health care provider can suggest ways to help relieve the discomfort. Breast engorgement should go away within a few days.  If you are breastfeeding: ? Wear a bra that supports your breasts and fits you well. ? Keep your nipples clean and dry. Apply creams and ointments as told by your health care provider. ? You may need to use breast pads to absorb milk that leaks from your breasts. ? You may have uterine contractions every time you breastfeed for up to several weeks after delivery. Uterine contractions help your uterus return to   its normal size. ? If you have any problems with breastfeeding, work with your health care provider or lactation consultant.  If you are not breastfeeding: ? Avoid touching your breasts a lot. Doing this can make your breasts produce more milk. ? Wear a good-fitting bra and use cold packs to help with swelling. ? Do not squeeze out (express) milk. This causes you to make more milk. Intimacy and sexuality  Ask your health care provider when you can  engage in sexual activity. This may depend on: ? Your risk of infection. ? How fast you are healing. ? Your comfort and desire to engage in sexual activity.  You are able to get pregnant after delivery, even if you have not had your period. If desired, talk with your health care provider about methods of birth control (contraception). Medicines  Take over-the-counter and prescription medicines only as told by your health care provider.  If you were prescribed an antibiotic medicine, take it as told by your health care provider. Do not stop taking the antibiotic even if you start to feel better. Activity  Gradually return to your normal activities as told by your health care provider. Ask your health care provider what activities are safe for you.  Rest as much as possible. Try to rest or take a nap while your baby is sleeping. Eating and drinking   Drink enough fluid to keep your urine pale yellow.  Eat high-fiber foods every day. These may help prevent or relieve constipation. High-fiber foods include: ? Whole grain cereals and breads. ? Brown rice. ? Beans. ? Fresh fruits and vegetables.  Do not try to lose weight quickly by cutting back on calories.  Take your prenatal vitamins until your postpartum checkup or until your health care provider tells you it is okay to stop. Lifestyle  Do not use any products that contain nicotine or tobacco, such as cigarettes and e-cigarettes. If you need help quitting, ask your health care provider.  Do not drink alcohol, especially if you are breastfeeding. General instructions  Keep all follow-up visits for you and your baby as told by your health care provider. Most women visit their health care provider for a postpartum checkup within the first 3-6 weeks after delivery. Contact a health care provider if:  You feel unable to cope with the changes that your child brings to your life, and these feelings do not go away.  You feel unusually  sad or worried.  Your breasts become red, painful, or hard.  You have a fever.  You have trouble holding urine or keeping urine from leaking.  You have little or no interest in activities you used to enjoy.  You have not breastfed at all and you have not had a menstrual period for 12 weeks after delivery.  You have stopped breastfeeding and you have not had a menstrual period for 12 weeks after you stopped breastfeeding.  You have questions about caring for yourself or your baby.  You pass a blood clot from your vagina. Get help right away if:  You have chest pain.  You have difficulty breathing.  You have sudden, severe leg pain.  You have severe pain or cramping in your lower abdomen.  You bleed from your vagina so much that you fill more than one sanitary pad in one hour. Bleeding should not be heavier than your heaviest period.  You develop a severe headache.  You faint.  You have blurred vision or spots in your vision.    You have bad-smelling vaginal discharge.  You have thoughts about hurting yourself or your baby. If you ever feel like you may hurt yourself or others, or have thoughts about taking your own life, get help right away. You can go to the nearest emergency department or call:  Your local emergency services (911 in the [place]  A suicide crisis helpline, such as the National Suicide Prevention Lifeline at 1-800-273-8255. This is open 24 hours a day. Summary  The period of time right after you deliver your newborn up to 6-12 weeks after delivery is called the postpartum period.  Gradually return to your normal activities as told by your health care provider.  Keep all follow-up visits for you and your baby as told by your health care provider. This information is not intended to replace advice given to you by your health care provider. Make sure you discuss any questions you have with your health care provider. Document Revised: 06/19/2017 Document  Reviewed: 03/30/2017 [employer] Patient Education  2020 [employer]  

## 2020-04-21 NOTE — Anesthesia Postprocedure Evaluation (Signed)
Anesthesia Post Note  Patient: Brianna Maddox  Procedure(s) Performed: AN AD HOC LABOR EPIDURAL     Patient location during evaluation: Mother Baby Anesthesia Type: Epidural Level of consciousness: awake and alert Pain management: pain level controlled Vital Signs Assessment: post-procedure vital signs reviewed and stable Respiratory status: spontaneous breathing, nonlabored ventilation and respiratory function stable Cardiovascular status: stable Postop Assessment: no headache, no backache and epidural receding Anesthetic complications: no   No complications documented.  Last Vitals:  Vitals:   04/20/20 2338 04/21/20 0415  BP: (!) 96/53 107/62  Pulse: 80 74  Resp: 16 17  Temp: 37.1 C 37 C  SpO2: 100% 100%    Last Pain:  Vitals:   04/21/20 0812  TempSrc:   PainSc: 0-No pain   Pain Goal:                Epidural/Spinal Function Cutaneous sensation: Normal sensation (04/21/20 0812)  Madison Hickman

## 2020-04-21 NOTE — Progress Notes (Signed)
Post Partum Day 1 Subjective: up ad lib, voiding, and tolerating PO Reports feeling better today than yesterday but still some weakness in her abdomen. Occasional right sided chest pain when she goes from sitting to standing, resolves with sitting. No SOB and no other CP  Objective: Patient Vitals for the past 24 hrs:  BP Temp Temp src Pulse Resp SpO2  04/21/20 0415 107/62 98.6 F (37 C) Oral 74 17 100 %  04/20/20 2338 (!) 96/53 98.7 F (37.1 C) Oral 80 16 100 %  04/20/20 2000 109/65 98.3 F (36.8 C) Oral 76 18 100 %  04/20/20 1555 107/65 98.4 F (36.9 C) Oral (!) 103 18 99 %  04/20/20 1445 123/68 98.6 F (37 C) Oral (!) 102 17 100 %  04/20/20 1401 (!) 103/52 -- -- 84 -- --  04/20/20 1346 (!) 103/54 -- -- 89 -- --  04/20/20 1331 107/60 -- -- 90 -- --  04/20/20 1322 (!) 108/55 -- -- 91 -- --  04/20/20 1317 106/65 -- -- 86 -- --  04/20/20 1311 113/61 -- -- 72 -- --  04/20/20 1306 (!) 111/59 -- -- 72 -- --  04/20/20 1301 115/64 -- -- 73 -- --  04/20/20 1256 114/61 -- -- 64 -- --  04/20/20 1251 109/65 -- -- 65 16 --  04/20/20 1245 (!) 108/49 -- -- (!) 56 -- --  04/20/20 1243 (!) 66/32 -- -- 77 -- --  04/20/20 1242 (!) 66/32 -- -- 77 -- --  04/20/20 1239 (!) 61/28 -- -- (!) 133 -- --  04/20/20 1216 114/65 -- -- 100 -- --  04/20/20 1201 (!) 113/58 -- -- 91 -- --  04/20/20 1146 117/64 -- -- 88 16 --  04/20/20 1136 -- -- -- -- -- 99 %  04/20/20 1132 114/62 -- -- (!) 57 -- --  04/20/20 1126 (!) 79/49 -- -- 87 -- --  04/20/20 1117 (!) 99/54 -- -- 91 -- --  04/20/20 1107 106/60 -- -- 98 16 --  04/20/20 1049 106/67 -- -- 92 16 --  04/20/20 1034 (!) 105/56 -- -- 73 -- --  04/20/20 1030 (!) 79/47 98.1 F (36.7 C) Oral (!) 139 18 --  04/20/20 1019 (!) 87/55 -- -- 76 -- --  04/20/20 1016 (!) 77/56 -- -- (!) 111 -- --  04/20/20 1002 (!) 96/53 -- -- 81 -- --    Physical Exam:  General: alert and no distress Lochia: appropriate Uterine Fundus: firm DVT Evaluation: No evidence of DVT  seen on physical exam.  Recent Labs    04/20/20 0442 04/20/20 1043 04/21/20 0419  WBC 9.2 10.8* 10.7*  HGB 11.7* 9.3* 7.3*  HCT 35.9* 28.6* 23.0*  PLT 135* 119* 114*    Recent Labs    04/20/20 1043  NA 135  K 4.3  CL 106  BUN 8  CREATININE 0.75  GLUCOSE 96    Recent Labs    04/20/20 1043  CALCIUM 7.8*    Assessment/Plan: DC today or tomorrow Brianna Maddox 29 y.o. [id number] PPD#1 sp TSVD/VBAC 1. PPC: Discussed ppc, activity expectations 2. ABLA: Hgb 11.7>7.3. Discussed blood transfusion if symptomatic/hypotensive/increased bleeding. Offered IV iron, patient opts for PO iron at discharge 3. Gestational thrombocytopenia: plan to recheck PP 4. Hypotensive episodes immediately PP: likely related to ABLA, improved with IVF, CBC/BMP/EKG. Symptoms improving and normotensive this AM 5 H/o anxiety: stable Received tdap and flu vaccine in pregnancy. COVID #1 on 9/11, planning to get #2 Rubella immune, blood type B  POS, breastfeeding, baby boy in room   LOS: 1 day   Jannetta Massey K Taam-Akelman 04/21/2020, 9:22 AM

## 2020-05-16 ENCOUNTER — Encounter (HOSPITAL_COMMUNITY): Payer: Self-pay | Admitting: Emergency Medicine

## 2020-05-16 ENCOUNTER — Emergency Department (HOSPITAL_COMMUNITY)
Admission: EM | Admit: 2020-05-16 | Discharge: 2020-05-16 | Disposition: A | Discharge status: Home / Self Care | Payer: Medicaid Other | Attending: Emergency Medicine | Admitting: Emergency Medicine

## 2020-05-16 ENCOUNTER — Emergency Department (HOSPITAL_COMMUNITY): Payer: Medicaid Other

## 2020-05-16 DIAGNOSIS — H538 Other visual disturbances: Secondary | ICD-10-CM | POA: Insufficient documentation

## 2020-05-16 DIAGNOSIS — Z87891 Personal history of nicotine dependence: Secondary | ICD-10-CM | POA: Insufficient documentation

## 2020-05-16 DIAGNOSIS — R079 Chest pain, unspecified: Secondary | ICD-10-CM | POA: Insufficient documentation

## 2020-05-16 DIAGNOSIS — R519 Headache, unspecified: Secondary | ICD-10-CM | POA: Diagnosis not present

## 2020-05-16 DIAGNOSIS — J45909 Unspecified asthma, uncomplicated: Secondary | ICD-10-CM | POA: Insufficient documentation

## 2020-05-16 DIAGNOSIS — Y92410 Unspecified street and highway as the place of occurrence of the external cause: Secondary | ICD-10-CM | POA: Insufficient documentation

## 2020-05-16 DIAGNOSIS — M545 Low back pain, unspecified: Secondary | ICD-10-CM | POA: Diagnosis not present

## 2020-05-16 DIAGNOSIS — M25532 Pain in left wrist: Secondary | ICD-10-CM | POA: Diagnosis not present

## 2020-05-16 MED ORDER — SILVER SULFADIAZINE 1 % EX CREA: 1.0000 "application " | TOPICAL_CREAM | Freq: Every day | CUTANEOUS | 0 refills | Status: DC

## 2020-05-16 MED ORDER — SILVER SULFADIAZINE 1 % EX CREA
TOPICAL_CREAM | Freq: Once | CUTANEOUS | Status: AC
  Administered 2020-05-16: 1 via TOPICAL
  Filled 2020-05-16: qty 85

## 2020-05-16 MED ORDER — BACLOFEN 10 MG PO TABS: 10.0000 mg | ORAL_TABLET | Freq: Two times a day (BID) | ORAL | 0 refills | Status: AC
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
## 2020-06-28 ENCOUNTER — Other Ambulatory Visit: Payer: Self-pay | Admitting: Internal Medicine

## 2020-07-14 ENCOUNTER — Other Ambulatory Visit: Payer: Self-pay | Admitting: Internal Medicine

## 2020-08-05 ENCOUNTER — Other Ambulatory Visit: Payer: Self-pay | Admitting: Adult Health

## 2020-08-07 ENCOUNTER — Other Ambulatory Visit: Payer: Self-pay | Admitting: Adult Health

## 2020-08-07 DIAGNOSIS — F321 Major depressive disorder, single episode, moderate: Secondary | ICD-10-CM

## 2020-08-08 ENCOUNTER — Other Ambulatory Visit: Payer: Self-pay | Admitting: Adult Health

## 2020-08-08 DIAGNOSIS — F321 Major depressive disorder, single episode, moderate: Secondary | ICD-10-CM

## 2020-08-08 MED ORDER — ESCITALOPRAM OXALATE 10 MG PO TABS: 10.0000 mg | ORAL_TABLET | Freq: Every day | ORAL | 1 refills | Status: DC

## 2020-08-24 ENCOUNTER — Other Ambulatory Visit: Payer: Self-pay | Admitting: Adult Health

## 2020-08-24 ENCOUNTER — Other Ambulatory Visit: Payer: Self-pay | Admitting: Internal Medicine

## 2020-08-24 MED ORDER — KETOCONAZOLE 2 % EX SHAM
1.0000 | MEDICATED_SHAMPOO | CUTANEOUS | 0 refills | Status: DC
Start: 2020-08-27 — End: 2020-08-24

## 2020-08-24 MED ORDER — FLUOCINOLONE ACETONIDE 0.01 % EX SHAM: MEDICATED_SHAMPOO | CUTANEOUS | 0 refills | Status: DC

## 2020-08-24 MED ORDER — KETOCONAZOLE 2 % EX SHAM: 1.0000 "application " | MEDICATED_SHAMPOO | CUTANEOUS | 0 refills | Status: DC

## 2020-08-28 ENCOUNTER — Other Ambulatory Visit: Payer: Self-pay | Admitting: Adult Health

## 2020-08-28 MED ORDER — CLOBETASOL PROPIONATE 0.05 % EX SOLN: 1.0000 "application " | Freq: Two times a day (BID) | CUTANEOUS | 0 refills | Status: DC

## 2020-09-05 ENCOUNTER — Encounter: Payer: Self-pay | Admitting: Family Medicine

## 2020-09-05 ENCOUNTER — Other Ambulatory Visit: Payer: Self-pay

## 2020-09-05 ENCOUNTER — Ambulatory Visit (INDEPENDENT_AMBULATORY_CARE_PROVIDER_SITE_OTHER): Payer: Medicaid Other | Admitting: Family Medicine

## 2020-09-05 VITALS — BP 98/60 | HR 77 | Ht 66.0 in | Wt 163.8 lb

## 2020-09-05 DIAGNOSIS — Z0000 Encounter for general adult medical examination without abnormal findings: Secondary | ICD-10-CM | POA: Diagnosis present

## 2020-09-05 DIAGNOSIS — D508 Other iron deficiency anemias: Secondary | ICD-10-CM | POA: Diagnosis not present

## 2020-09-05 DIAGNOSIS — R21 Rash and other nonspecific skin eruption: Secondary | ICD-10-CM | POA: Diagnosis not present

## 2020-09-05 MED ORDER — LEVOCETIRIZINE DIHYDROCHLORIDE 5 MG PO TABS
ORAL_TABLET | ORAL | 0 refills | Status: DC
Start: 2020-09-05 — End: 2020-12-10

## 2020-09-05 NOTE — Assessment & Plan Note (Signed)
No current findings on Physical Exam.  Can continue usuing Ketoconazole shampoo though based on patient's description, less likely to be fungal. - Continue to monitor, can return to office if rash returns

## 2020-09-05 NOTE — Progress Notes (Signed)
    SUBJECTIVE:   CHIEF COMPLAINT / HPI: Establish Care  Patient here to establish care.  Indicates had hard time seeing previous doctor due to issues with insurance.  Patient indicates she has had intermittent rash lately that comes and goes on head, forehead and eyelids.  Indicates had virtual visit with previous doctor who prescribed steroid gel and a ketoconazole shampoo.  Indicates these helped control rash and is not currently having outbreak.  Patient also had significant anemia last October with latest pregnancy and delivery.  Lowest point was 7.3 day after delivery, and patient indicates she became extremely weak and could barely move.  Indicates she eventually recovered and did not require tranfusion.  Has not had Hemoglobin check since.  Does endorse getting lightheaded and dizziness with going from sitting and standing but denies any syncopal episodes and her episodes self-resolve quickly.  Had previously been prescribed iron pill but not taking recently.  Is taking pre-natal Vitamin as is breastfeeding.  PERTINENT  PMH / PSH: Anemia  OBJECTIVE:   BP 98/60   Pulse 77   Ht 5\' 6"  (1.676 m)   Wt 163 lb 12.8 oz (74.3 kg)   SpO2 99%   BMI 26.44 kg/m    Physical Exam Constitutional:      General: She is not in acute distress.    Appearance: Normal appearance. She is not ill-appearing.  HENT:     Head: Normocephalic and atraumatic. Hair is normal.     Mouth/Throat:     Mouth: Mucous membranes are moist.  Cardiovascular:     Rate and Rhythm: Normal rate and regular rhythm.     Pulses: Normal pulses.  Pulmonary:     Effort: Pulmonary effort is normal.     Breath sounds: Normal breath sounds.  Skin:    General: Skin is warm.     Comments: No rash observed on hair or head, does have some dry skin on forehead  Neurological:     Mental Status: She is alert.  Psychiatric:        Mood and Affect: Mood normal.        Behavior: Behavior normal.     ASSESSMENT/PLAN:    Anemia Last Hgb was 7.3 following pregnancy.  Likely has rebounded since blood loss with birth, though patient does have presyncope and dizziness with standing occasionally.  BP 98/60 today, though patient indicates is usually lower than this. - Obtain CBC, consider restarting Iron based on results - Encourage to drink plenty of fluids - Consider taking Orthostatic BP at next visit  Rash and nonspecific skin eruption No current findings on Physical Exam.  Can continue usuing Ketoconazole shampoo though based on patient's description, less likely to be fungal. - Continue to monitor, can return to office if rash returns    Establish Care Limited introductory visit today.  Praised patient for continued smoking cessation. - Obtain CMP and Hep C - follow up on vaccines and appropriate screening at visit in 1 month  , MD [facility]

## 2020-09-05 NOTE — Assessment & Plan Note (Signed)
Last Hgb was 7.3 following pregnancy.  Likely has rebounded since blood loss with birth, though patient does have presyncope and dizziness with standing occasionally.  BP 98/60 today, though patient indicates is usually lower than this. - Obtain CBC, consider restarting Iron based on results - Encourage to drink plenty of fluids - Consider taking Orthostatic BP at next visit

## 2020-09-05 NOTE — Patient Instructions (Addendum)
It was great to meet you today Brianna Maddox.  Thank you for coming in.  I will follow-up with you in 1 month and will plan on obtaining labs today.  I would like to see you again in 1 month.  I will call you with your lab results if any are abnormal.  Be Well, Dr Pecola Leisure

## 2020-09-06 LAB — COMPREHENSIVE METABOLIC PANEL
ALT: 19 IU/L (ref 0–32)
AST: 15 IU/L (ref 0–40)
Albumin/Globulin Ratio: 1.8 (ref 1.2–2.2)
Albumin: 4.4 g/dL (ref 3.9–5.0)
Alkaline Phosphatase: 84 IU/L (ref 44–121)
BUN/Creatinine Ratio: 14 (ref 9–23)
BUN: 10 mg/dL (ref 6–20)
Bilirubin Total: 0.4 mg/dL (ref 0.0–1.2)
CO2: 24 mmol/L (ref 20–29)
Calcium: 9.6 mg/dL (ref 8.7–10.2)
Chloride: 102 mmol/L (ref 96–106)
Creatinine, Ser: 0.69 mg/dL (ref 0.57–1.00)
Globulin, Total: 2.5 g/dL (ref 1.5–4.5)
Glucose: 83 mg/dL (ref 65–99)
Potassium: 4.3 mmol/L (ref 3.5–5.2)
Sodium: 142 mmol/L (ref 134–144)
Total Protein: 6.9 g/dL (ref 6.0–8.5)
eGFR: 120 mL/min/{1.73_m2} (ref >59)

## 2020-09-06 LAB — CBC WITH DIFFERENTIAL/PLATELET
Basophils Absolute: 0 10*3/uL (ref 0.0–0.2)
Basos: 1 %
EOS (ABSOLUTE): 0.4 10*3/uL (ref 0.0–0.4)
Eos: 7 %
Hematocrit: 39.7 % (ref 34.0–46.6)
Hemoglobin: 13.3 g/dL (ref 11.1–15.9)
Immature Grans (Abs): 0 10*3/uL (ref 0.0–0.1)
Immature Granulocytes: 0 %
Lymphocytes Absolute: 1.7 10*3/uL (ref 0.7–3.1)
Lymphs: 30 %
MCH: 30.8 pg (ref 26.6–33.0)
MCHC: 33.5 g/dL (ref 31.5–35.7)
MCV: 92 fL (ref 79–97)
Monocytes Absolute: 0.5 10*3/uL (ref 0.1–0.9)
Monocytes: 8 %
Neutrophils Absolute: 3 10*3/uL (ref 1.4–7.0)
Neutrophils: 54 %
Platelets: 231 10*3/uL (ref 150–450)
RBC: 4.32 x10E6/uL (ref 3.77–5.28)
RDW: 12.9 % (ref 11.7–15.4)
WBC: 5.6 10*3/uL (ref 3.4–10.8)

## 2020-09-06 LAB — HEPATITIS C ANTIBODY: Hep C Virus Ab: <0.1 s/co ratio (ref 0.0–0.9)

## 2020-09-18 ENCOUNTER — Encounter: Payer: Self-pay | Admitting: Adult Health

## 2020-10-08 ENCOUNTER — Ambulatory Visit: Payer: Medicaid Other | Admitting: Family Medicine

## 2020-10-15 ENCOUNTER — Other Ambulatory Visit: Payer: Self-pay | Admitting: *Deleted

## 2020-10-15 DIAGNOSIS — F321 Major depressive disorder, single episode, moderate: Secondary | ICD-10-CM

## 2020-10-15 MED ORDER — ESCITALOPRAM OXALATE 10 MG PO TABS: 10.0000 mg | ORAL_TABLET | Freq: Every day | ORAL | 1 refills | Status: DC

## 2020-10-15 MED ORDER — MONTELUKAST SODIUM 10 MG PO TABS: ORAL_TABLET | ORAL | 3 refills | Status: DC

## 2020-10-17 ENCOUNTER — Ambulatory Visit: Payer: Medicaid Other

## 2020-10-22 NOTE — Telephone Encounter (Signed)
LMOVM for pt to call back and make a follow up appt with dr Pecola Leisure. Jonta Gastineau Bruna Potter, CMA

## 2020-11-18 NOTE — Progress Notes (Signed)
SUBJECTIVE:   CHIEF COMPLAINT / HPI: pruritis and bruising   Patient reports that she has been having pruritis all over her body and noticed bruising on her legs that occurs despite her not having any trauma to her LEs. She reports that this has been present for ~1 month. Patient denies any new medications and currently takes Lexapro, Singular and Xyzal. She is not aware of family hx of coagulopathy and reports that she is adopted. The bruising is not painful.   Pruitis has also been present for a month. She has tried multiple topical therapies to see if that helped with any issue of dry skin. She denies any other household members with similar pruritis. She has not noticed ay changes to her skin except the aforementioned bruising. She denies recreational drug use.   Forearm numbness  The numbness and pain in her bialteral foreamrs has been present for over one year and has not been able to figure out what could be causing it.  She reports that the pain is usually more present in the later part of the day.  She states that there has not much she can do to alleviate the pain.  She tried taking Tylenol that does not seem to make any changes.  She denies a presence of weakness but states that sometimes from her elbow to her hands will feel sore.  She describes the feeling as a "dead weight" type feeling but is still able to move her hands and arms. She denies hx of surgical procedures to her arms.   PERTINENT  PMH / PSH:  Asthma Anemia MDD Multiple food allergies Vitamin D deficiency   OBJECTIVE:   BP 110/80   Pulse 79   Ht 5\' 6"  (1.676 m)   Wt 158 lb 6.4 oz (71.8 kg)   SpO2 97%   BMI 25.57 kg/m  General: female appearing stated age in no acute distress  Cardio: Normal S1 and S2, no S3 or S4. Rhythm is regular. No murmurs or rubs.  Bilateral radial pulses palpable Pulm: Clear to auscultation bilaterally, no crackles, wheezing, or diminished breath sounds. Normal respiratory effort,  stable on RA Abdomen: Bowel sounds normal. Abdomen soft and non-tender.  Extremities: No peripheral edema. Warm/ well perfused. Bilateral UEs without swelling or erythema, no lacerations, no nodules, no scars  Neuro: pt alert and oriented x4, 4/5 grip strength bilaterally, no thenar atrophy bilaterally, sensation intact to light touch throughout bilateral upper and lower extremities, normal gait, patient has normal small motor movements of hands, normal ROM of bilateral wrists and phalanges    ASSESSMENT/PLAN:   Paresthesias Patient presenting with chronic issues of paresthesia, mainly numbness in bilateral upper extremities specifically bilateral forearms.  Do not note any visual abnormalities to her bilateral forearms.  Patient has somewhat decreased grip strength in bilateral hands.  Otherwise strength of upper extremities 5/5.  Patient has intact sensation in bilateral upper extremities as well as lower extremities.  Patient denies recent trauma or surgeries to his bilateral arms.  Consider diagnosis of neuropathy.  Offered patient gabapentin trial which she is agreeable to.  Also considering other neuropathic pain and patient may benefit from nerve conduction studies in your neurology referral if pain persist or does not improve with gabapentin trial.  Patient is on Lexapro for depression so could also consider somatic presentation or fibromyalgia -Prescribed gabapentin 100 mg at bedtime -Patient scheduled for follow-up in 2-3 weeks -Hemoglobin A1c to check for diabetes -CMP to look for electrolyte  abnormalities and magnesium -CBC to check for anemia -TSH   Pruritus General pruritus of unknown etiology.  Patient had normal labs in March 2022 did not indicate any renal dysfunction could be contributing.  No other household contacts with similar symptoms so lower suspicion for infestation contributing.  Patient has no abnormal skin findings. We will complete CMP to make sure.  There are no  renal changes in the interim Patient to follow-up in 2 weeks.     Ronnald Ramp, MD [facility]

## 2020-11-19 ENCOUNTER — Other Ambulatory Visit: Payer: Self-pay

## 2020-11-19 ENCOUNTER — Ambulatory Visit (INDEPENDENT_AMBULATORY_CARE_PROVIDER_SITE_OTHER): Payer: Medicaid Other | Admitting: Family Medicine

## 2020-11-19 VITALS — BP 110/80 | HR 79 | Ht 66.0 in | Wt 158.4 lb

## 2020-11-19 DIAGNOSIS — L299 Pruritus, unspecified: Secondary | ICD-10-CM | POA: Diagnosis not present

## 2020-11-19 DIAGNOSIS — M79602 Pain in left arm: Secondary | ICD-10-CM | POA: Diagnosis not present

## 2020-11-19 DIAGNOSIS — R202 Paresthesia of skin: Secondary | ICD-10-CM

## 2020-11-19 DIAGNOSIS — R200 Anesthesia of skin: Secondary | ICD-10-CM | POA: Diagnosis not present

## 2020-11-19 DIAGNOSIS — R238 Other skin changes: Secondary | ICD-10-CM | POA: Diagnosis not present

## 2020-11-19 DIAGNOSIS — R233 Spontaneous ecchymoses: Secondary | ICD-10-CM

## 2020-11-19 DIAGNOSIS — M79601 Pain in right arm: Secondary | ICD-10-CM

## 2020-11-19 MED ORDER — GABAPENTIN 100 MG PO CAPS: 100.0000 mg | ORAL_CAPSULE | Freq: Three times a day (TID) | ORAL | 0 refills | Status: DC

## 2020-11-19 NOTE — Patient Instructions (Signed)
It was a pleasure to see you today!  Thank you for choosing [facility] for your primary care.   Brianna Maddox was seen for itching, numbness and brusing.   Our plans for today were:  We will collect blood work to investigate various potential causes for your symptoms.   I will notify you via MyChart to discuss results.   Please continue to monitor your symptoms and keep record including pictures of when you notice bruising or the arm pains.   I have also prescribed a medication to treat your symptoms. You can start by taking 100mg  at bedtime and increase to three times daily if it does not make you too sleepy.    You should return to our clinic in 3-4 weeks for follow up for itching, bruising and numbness in arms.   Best Wishes,   Dr. 

## 2020-11-20 DIAGNOSIS — L299 Pruritus, unspecified: Secondary | ICD-10-CM | POA: Insufficient documentation

## 2020-11-20 DIAGNOSIS — M79601 Pain in right arm: Secondary | ICD-10-CM | POA: Insufficient documentation

## 2020-11-20 LAB — COMPREHENSIVE METABOLIC PANEL
ALT: 11 IU/L (ref 0–32)
AST: 11 IU/L (ref 0–40)
Albumin/Globulin Ratio: 2 (ref 1.2–2.2)
Albumin: 4.5 g/dL (ref 3.9–5.0)
Alkaline Phosphatase: 81 IU/L (ref 44–121)
BUN/Creatinine Ratio: 18 (ref 9–23)
BUN: 12 mg/dL (ref 6–20)
Bilirubin Total: 0.2 mg/dL (ref 0.0–1.2)
CO2: 22 mmol/L (ref 20–29)
Calcium: 8.9 mg/dL (ref 8.7–10.2)
Chloride: 103 mmol/L (ref 96–106)
Creatinine, Ser: 0.67 mg/dL (ref 0.57–1.00)
Globulin, Total: 2.2 g/dL (ref 1.5–4.5)
Glucose: 92 mg/dL (ref 65–99)
Potassium: 4.4 mmol/L (ref 3.5–5.2)
Sodium: 139 mmol/L (ref 134–144)
Total Protein: 6.7 g/dL (ref 6.0–8.5)
eGFR: 121 mL/min/{1.73_m2} (ref >59)

## 2020-11-20 LAB — PROTIME-INR
INR: 1 (ref 0.9–1.2)
Prothrombin Time: 10.4 s (ref 9.1–12.0)

## 2020-11-20 LAB — CBC
Hematocrit: 39.6 % (ref 34.0–46.6)
Hemoglobin: 13.3 g/dL (ref 11.1–15.9)
MCH: 31.7 pg (ref 26.6–33.0)
MCHC: 33.6 g/dL (ref 31.5–35.7)
MCV: 94 fL (ref 79–97)
Platelets: 240 10*3/uL (ref 150–450)
RBC: 4.2 x10E6/uL (ref 3.77–5.28)
RDW: 11.9 % (ref 11.7–15.4)
WBC: 5.2 10*3/uL (ref 3.4–10.8)

## 2020-11-20 LAB — HEMOGLOBIN A1C
Est. average glucose Bld gHb Est-mCnc: 105 mg/dL
Hgb A1c MFr Bld: 5.3 % (ref 4.8–5.6)

## 2020-11-20 LAB — TSH: TSH: 1.25 u[IU]/mL (ref 0.450–4.500)

## 2020-11-20 LAB — CK: Total CK: 58 U/L (ref 32–182)

## 2020-11-20 LAB — MAGNESIUM: Magnesium: 1.8 mg/dL (ref 1.6–2.3)

## 2020-11-20 NOTE — Assessment & Plan Note (Addendum)
Patient presenting with chronic issues of paresthesia, mainly numbness in bilateral upper extremities specifically bilateral forearms.  Do not note any visual abnormalities to her bilateral forearms.  Patient has somewhat decreased grip strength in bilateral hands.  Otherwise strength of upper extremities 5/5.  Patient has intact sensation in bilateral upper extremities as well as lower extremities.  Patient denies recent trauma or surgeries to his bilateral arms.  Consider diagnosis of neuropathy.  Offered patient gabapentin trial which she is agreeable to.  Also considering other neuropathic pain and patient may benefit from nerve conduction studies in your neurology referral if pain persist or does not improve with gabapentin trial.  Patient is on Lexapro for depression so could also consider somatic presentation or fibromyalgia -Prescribed gabapentin 100 mg at bedtime -Patient scheduled for follow-up in 2-3 weeks -Hemoglobin A1c to check for diabetes -CMP to look for electrolyte abnormalities and magnesium -CBC to check for anemia -TSH

## 2020-11-20 NOTE — Assessment & Plan Note (Signed)
General pruritus of unknown etiology.  Patient had normal labs in March 2022 did not indicate any renal dysfunction could be contributing.  No other household contacts with similar symptoms so lower suspicion for infestation contributing.  Patient has no abnormal skin findings. We will complete CMP to make sure.  There are no renal changes in the interim Patient to follow-up in 2 weeks.

## 2020-12-08 ENCOUNTER — Other Ambulatory Visit: Payer: Self-pay | Admitting: Family Medicine

## 2020-12-14 ENCOUNTER — Encounter: Payer: Self-pay | Admitting: Family Medicine

## 2020-12-14 ENCOUNTER — Other Ambulatory Visit: Payer: Self-pay

## 2020-12-14 ENCOUNTER — Ambulatory Visit (INDEPENDENT_AMBULATORY_CARE_PROVIDER_SITE_OTHER): Payer: Medicaid Other | Admitting: Family Medicine

## 2020-12-14 DIAGNOSIS — R202 Paresthesia of skin: Secondary | ICD-10-CM

## 2020-12-14 DIAGNOSIS — L299 Pruritus, unspecified: Secondary | ICD-10-CM

## 2020-12-14 NOTE — Patient Instructions (Signed)
It was good to see you today.  Thank you for coming in.  I recommend taking Ibuprofen 200-400 mg as needed for joint pain when they are acting up  Be Well, Dr Pecola Leisure

## 2020-12-14 NOTE — Progress Notes (Signed)
           SUBJECTIVE:   CHIEF COMPLAINT / HPI: Follow-up  Patient continues to have occasional itchiness.  Indicates has improved with taking Xyzal.  Has had previous Allergy work-up but could not afford/not interested in treatment.  Also wishing to avoid frequent use of Benadryl due to drowziness.  Also has joint pain with exercise.  Was prescribed Gabapentin one month ago, but patient did not end up taking as didn't want to take and would rather avoid taking medications.    Patient had normal pap smear in 2021 with last pregnancy.    PERTINENT  PMH / PSH:  None  OBJECTIVE:   BP 108/71   Pulse 83   Wt 157 lb 12.8 oz (71.6 kg)   SpO2 100%   BMI 25.47 kg/m    Physical Exam Constitutional:      General: She is not in acute distress.    Appearance: She is not ill-appearing.  HENT:     Head: Normocephalic and atraumatic.     Mouth/Throat:     Mouth: Mucous membranes are moist.  Cardiovascular:     Rate and Rhythm: Normal rate and regular rhythm.  Pulmonary:     Effort: Pulmonary effort is normal.     Breath sounds: Normal breath sounds.  Musculoskeletal:        General: No swelling or tenderness.     Right lower leg: No edema.     Left lower leg: No edema.  Skin:    General: Skin is warm.  Neurological:     Mental Status: She is alert.     ASSESSMENT/PLAN:   Pruritus Improved with Xyzal.  Lab work so far unremarkable.  No signs of liver abnormality  Indicated glad Xyzal helping with patient and have ehausted further interventions at this time. - Occasional Benadryl use for flare-ups, though in agreement with patient that want to avoid using too frequently  Paresthesias Lab work so far unremarkable.  Fine with discontinuing Gabapentin.  No significant swelling or erythema of joints on Physical exam.  Less likely to be auto-immune process and do not believe further labs or imaging indicated at this time.  Possible Fibromyalgia. - Continue Lexpro 10 mg -  Occasional Ibuprofen 200-400 mg as needed for joint pains with exercise     Jovita Kussmaul, MD [facility]    Had Pap smear doen with last pregnancy 8 months ago.

## 2020-12-15 NOTE — Assessment & Plan Note (Signed)
Improved with Xyzal.  Lab work so far unremarkable.  No signs of liver abnormality  Indicated glad Xyzal helping with patient and have ehausted further interventions at this time. - Occasional Benadryl use for flare-ups, though in agreement with patient that want to avoid using too frequently

## 2020-12-15 NOTE — Assessment & Plan Note (Signed)
Lab work so far unremarkable.  Fine with discontinuing Gabapentin.  No significant swelling or erythema of joints on Physical exam.  Less likely to be auto-immune process and do not believe further labs or imaging indicated at this time.  Possible Fibromyalgia. - Continue Lexpro 10 mg - Occasional Ibuprofen 200-400 mg as needed for joint pains with exercise

## 2021-01-23 ENCOUNTER — Encounter: Payer: BC Managed Care – PPO | Admitting: Adult Health

## 2021-03-22 ENCOUNTER — Ambulatory Visit: Payer: Self-pay | Admitting: *Deleted

## 2021-03-22 NOTE — Telephone Encounter (Signed)
Reason for Disposition  Patient sounds very sick or weak to the triager  Answer Assessment - Initial Assessment Questions 1. LOCATION: "Where does it hurt?"      Abdomen mostly in lower area  2. RADIATION: "Does the pain shoot anywhere else?" (e.g., chest, back)     In chest  3. ONSET: "When did the pain begin?" (e.g., minutes, hours or days ago)      This am  4. SUDDEN: "Gradual or sudden onset?"     Gradual  5. PATTERN "Does the pain come and go, or is it constant?"    - If constant: "Is it getting better, staying the same, or worsening?"      (Note: Constant means the pain never goes away completely; most serious pain is constant and it progresses)     - If intermittent: "How long does it last?" "Do you have pain now?"     (Note: Intermittent means the pain goes away completely between bouts)     Constant  6. SEVERITY: "How bad is the pain?"  (e.g., Scale 1-10; mild, moderate, or severe)   - MILD (1-3): doesn't interfere with normal activities, abdomen soft and not tender to touch    - MODERATE (4-7): interferes with normal activities or awakens from sleep, abdomen tender to touch    - SEVERE (8-10): excruciating pain, doubled over, unable to do any normal activities      Moderate to severe 7. RECURRENT SYMPTOM: "Have you ever had this type of stomach pain before?" If Yes, ask: "When was the last time?" and "What happened that time?"      No  8. CAUSE: "What do you think is causing the stomach pain?"     Not sure  9. RELIEVING/AGGRAVATING FACTORS: "What makes it better or worse?" (e.g., movement, antacids, bowel movement)    Worse laying down  10. OTHER SYMPTOMS: "Do you have any other symptoms?" (e.g., back pain, diarrhea, fever, urination pain, vomiting)       Nausea , no appetite  headache , heart rate fast . Weakness standing  11. PREGNANCY: "Is there any chance you are pregnant?" "When was your last menstrual period?"       Na LMP last week  Protocols used: Abdominal Pain -  [facility]

## 2021-03-22 NOTE — Telephone Encounter (Signed)
C/o severe abdominal pain since this am. No appetite. C/o nausea, constipation no BM since yesterday . Cough noted and left side of abd. Painful. Weakness standing. Fast heart rate., headache. Drinking fluids makes nausea worse. Denies chest pain or difficulty breathing. Abdominal pain worse laying down. Instructed patient to go to ED for evaluation and have someone else drive . Care advise given. Patient verbalized understanding of care advise and to go to ED now or call 911 if symptoms worsen.

## 2021-04-05 ENCOUNTER — Other Ambulatory Visit: Payer: Self-pay

## 2021-04-05 MED ORDER — LEVOCETIRIZINE DIHYDROCHLORIDE 5 MG PO TABS: ORAL_TABLET | ORAL | 2 refills | Status: DC

## 2021-04-21 ENCOUNTER — Encounter (HOSPITAL_BASED_OUTPATIENT_CLINIC_OR_DEPARTMENT_OTHER): Payer: Self-pay

## 2021-04-21 ENCOUNTER — Emergency Department (HOSPITAL_BASED_OUTPATIENT_CLINIC_OR_DEPARTMENT_OTHER)
Admission: EM | Admit: 2021-04-21 | Discharge: 2021-04-21 | Disposition: A | Discharge status: Home / Self Care | Payer: Medicaid Other | Attending: Emergency Medicine | Admitting: Emergency Medicine

## 2021-04-21 ENCOUNTER — Other Ambulatory Visit: Payer: Self-pay

## 2021-04-21 DIAGNOSIS — J45909 Unspecified asthma, uncomplicated: Secondary | ICD-10-CM | POA: Insufficient documentation

## 2021-04-21 DIAGNOSIS — R1013 Epigastric pain: Secondary | ICD-10-CM | POA: Diagnosis not present

## 2021-04-21 DIAGNOSIS — Z87891 Personal history of nicotine dependence: Secondary | ICD-10-CM | POA: Insufficient documentation

## 2021-04-21 DIAGNOSIS — R42 Dizziness and giddiness: Secondary | ICD-10-CM | POA: Insufficient documentation

## 2021-04-21 DIAGNOSIS — Z79899 Other long term (current) drug therapy: Secondary | ICD-10-CM | POA: Diagnosis not present

## 2021-04-21 DIAGNOSIS — R112 Nausea with vomiting, unspecified: Secondary | ICD-10-CM | POA: Diagnosis present

## 2021-04-21 LAB — URINALYSIS, ROUTINE W REFLEX MICROSCOPIC
Bilirubin Urine: NEGATIVE
Glucose, UA: NEGATIVE mg/dL
Hgb urine dipstick: NEGATIVE
Ketones, ur: >80 mg/dL — AB
Leukocytes,Ua: NEGATIVE
Nitrite: NEGATIVE
Protein, ur: 30 mg/dL — AB
Specific Gravity, Urine: 1.034 — ABNORMAL HIGH (ref 1.005–1.030)
pH: 6 (ref 5.0–8.0)

## 2021-04-21 LAB — COMPREHENSIVE METABOLIC PANEL
ALT: 13 U/L (ref 0–44)
AST: 12 U/L — ABNORMAL LOW (ref 15–41)
Albumin: 4.7 g/dL (ref 3.5–5.0)
Alkaline Phosphatase: 67 U/L (ref 38–126)
Anion gap: 11 (ref 5–15)
BUN: 9 mg/dL (ref 6–20)
CO2: 26 mmol/L (ref 22–32)
Calcium: 10 mg/dL (ref 8.9–10.3)
Chloride: 102 mmol/L (ref 98–111)
Creatinine, Ser: 0.63 mg/dL (ref 0.44–1.00)
GFR, Estimated: >60 mL/min (ref >60)
Glucose, Bld: 89 mg/dL (ref 70–99)
Potassium: 3.7 mmol/L (ref 3.5–5.1)
Sodium: 139 mmol/L (ref 135–145)
Total Bilirubin: 0.4 mg/dL (ref 0.3–1.2)
Total Protein: 7.6 g/dL (ref 6.5–8.1)

## 2021-04-21 LAB — CBC WITH DIFFERENTIAL/PLATELET
Abs Immature Granulocytes: 0.05 10*3/uL (ref 0.00–0.07)
Basophils Absolute: 0.1 10*3/uL (ref 0.0–0.1)
Basophils Relative: 1 %
Eosinophils Absolute: 0.1 10*3/uL (ref 0.0–0.5)
Eosinophils Relative: 0 %
HCT: 38.5 % (ref 36.0–46.0)
Hemoglobin: 13 g/dL (ref 12.0–15.0)
Immature Granulocytes: 0 %
Lymphocytes Relative: 10 %
Lymphs Abs: 1.3 10*3/uL (ref 0.7–4.0)
MCH: 31 pg (ref 26.0–34.0)
MCHC: 33.8 g/dL (ref 30.0–36.0)
MCV: 91.7 fL (ref 80.0–100.0)
Monocytes Absolute: 0.6 10*3/uL (ref 0.1–1.0)
Monocytes Relative: 4 %
Neutro Abs: 12 10*3/uL — ABNORMAL HIGH (ref 1.7–7.7)
Neutrophils Relative %: 85 %
Platelets: 294 10*3/uL (ref 150–400)
RBC: 4.2 MIL/uL (ref 3.87–5.11)
RDW: 13.5 % (ref 11.5–15.5)
WBC: 14.2 10*3/uL — ABNORMAL HIGH (ref 4.0–10.5)
nRBC: 0 % (ref 0.0–0.2)

## 2021-04-21 LAB — RAPID URINE DRUG SCREEN, HOSP PERFORMED
Amphetamines: NOT DETECTED
Barbiturates: NOT DETECTED
Benzodiazepines: NOT DETECTED
Cocaine: NOT DETECTED
Opiates: NOT DETECTED
Tetrahydrocannabinol: POSITIVE — AB

## 2021-04-21 LAB — LIPASE, BLOOD: Lipase: <10 U/L — ABNORMAL LOW (ref 11–51)

## 2021-04-21 LAB — PREGNANCY, URINE: Preg Test, Ur: NEGATIVE

## 2021-04-21 MED ORDER — SODIUM CHLORIDE 0.9 % IV BOLUS
1000.0000 mL | Freq: Once | INTRAVENOUS | Status: AC
Start: 2021-04-21 — End: 2021-04-21
  Administered 2021-04-21: 1000 mL via INTRAVENOUS

## 2021-04-21 MED ORDER — ONDANSETRON 4 MG PO TBDP: 8.0000 mg | ORAL_TABLET | Freq: Once | ORAL | Status: DC

## 2021-04-21 MED ORDER — ONDANSETRON 4 MG PO TBDP: 4.0000 mg | ORAL_TABLET | Freq: Three times a day (TID) | ORAL | 2 refills | Status: DC | PRN

## 2021-04-21 MED ORDER — ONDANSETRON HCL 4 MG/2ML IJ SOLN
4.0000 mg | Freq: Once | INTRAMUSCULAR | Status: AC
  Administered 2021-04-21: 4 mg via INTRAVENOUS
  Filled 2021-04-21: qty 2

## 2021-04-21 NOTE — Discharge Instructions (Signed)
Take Zofran every 8 hours as needed for nausea and vomiting. Continue to drink oral fluids and to stay hydrated. Return to the ED if you start having worsening or more severe abdominal pain for additional work-up.

## 2021-04-21 NOTE — ED Notes (Signed)
Pt tolerated PO fluids

## 2021-04-21 NOTE — ED Notes (Signed)
Pt unable to void , will attempt again later.
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
## 2021-04-21 NOTE — ED Notes (Signed)
Pt verbalizes understanding of discharge instructions. Opportunity for questioning and answers were provided. Armand removed by staff, pt discharged from ED to home. Educated to pick up nausea meds and continue to drink PO fluids.
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
## 2021-04-29 ENCOUNTER — Other Ambulatory Visit: Payer: Self-pay | Admitting: *Deleted

## 2021-04-29 DIAGNOSIS — F321 Major depressive disorder, single episode, moderate: Secondary | ICD-10-CM

## 2021-04-29 MED ORDER — ESCITALOPRAM OXALATE 10 MG PO TABS: 10.0000 mg | ORAL_TABLET | Freq: Every day | ORAL | 1 refills | Status: DC

## 2021-05-06 ENCOUNTER — Emergency Department (HOSPITAL_BASED_OUTPATIENT_CLINIC_OR_DEPARTMENT_OTHER)
Admission: EM | Admit: 2021-05-06 | Discharge: 2021-05-06 | Disposition: A | Discharge status: Home / Self Care | Payer: Medicaid Other | Attending: Emergency Medicine | Admitting: Emergency Medicine

## 2021-05-06 ENCOUNTER — Emergency Department (HOSPITAL_BASED_OUTPATIENT_CLINIC_OR_DEPARTMENT_OTHER): Payer: Medicaid Other | Admitting: Radiology

## 2021-05-06 ENCOUNTER — Encounter (HOSPITAL_BASED_OUTPATIENT_CLINIC_OR_DEPARTMENT_OTHER): Payer: Self-pay

## 2021-05-06 ENCOUNTER — Other Ambulatory Visit: Payer: Self-pay

## 2021-05-06 DIAGNOSIS — M549 Dorsalgia, unspecified: Secondary | ICD-10-CM | POA: Diagnosis not present

## 2021-05-06 DIAGNOSIS — Z5321 Procedure and treatment not carried out due to patient leaving prior to being seen by health care provider: Secondary | ICD-10-CM | POA: Insufficient documentation

## 2021-05-06 DIAGNOSIS — R509 Fever, unspecified: Secondary | ICD-10-CM | POA: Insufficient documentation

## 2021-05-06 DIAGNOSIS — Z20822 Contact with and (suspected) exposure to covid-19: Secondary | ICD-10-CM | POA: Insufficient documentation

## 2021-05-06 DIAGNOSIS — R109 Unspecified abdominal pain: Secondary | ICD-10-CM | POA: Diagnosis not present

## 2021-05-06 LAB — URINALYSIS, ROUTINE W REFLEX MICROSCOPIC
Bilirubin Urine: NEGATIVE
Glucose, UA: NEGATIVE mg/dL
Hgb urine dipstick: NEGATIVE
Ketones, ur: NEGATIVE mg/dL
Leukocytes,Ua: NEGATIVE
Nitrite: NEGATIVE
Protein, ur: NEGATIVE mg/dL
Specific Gravity, Urine: 1.021 (ref 1.005–1.030)
pH: 6.5 (ref 5.0–8.0)

## 2021-05-06 LAB — RESP PANEL BY RT-PCR (FLU A&B, COVID) ARPGX2
Influenza A by PCR: NEGATIVE
Influenza B by PCR: NEGATIVE
SARS Coronavirus 2 by RT PCR: NEGATIVE

## 2021-05-06 LAB — PREGNANCY, URINE: Preg Test, Ur: NEGATIVE

## 2021-05-06 MED ORDER — ACETAMINOPHEN 325 MG PO TABS
650.0000 mg | ORAL_TABLET | Freq: Once | ORAL | Status: AC | PRN
  Administered 2021-05-06: 650 mg via ORAL

## 2021-05-06 NOTE — ED Triage Notes (Signed)
Patient here POV from Home with Fever, Body Aches, and Right Flank/Back Pain.  Patient states symptoms began this AM with Acute Onset. Pain is worse with Inspiration. No Urinary Symptoms.   Temp this AM: 104. A&Ox4. GCS 15. NAD Noted during Triage. Ambulatory.

## 2021-07-11 ENCOUNTER — Ambulatory Visit: Payer: Medicaid Other

## 2021-08-05 ENCOUNTER — Other Ambulatory Visit: Payer: Self-pay | Admitting: Student

## 2021-09-19 ENCOUNTER — Encounter: Payer: Self-pay | Admitting: Adult Health

## 2021-11-12 ENCOUNTER — Other Ambulatory Visit: Payer: Self-pay | Admitting: Student

## 2021-11-15 ENCOUNTER — Other Ambulatory Visit: Payer: Self-pay | Admitting: *Deleted

## 2021-11-16 MED ORDER — MONTELUKAST SODIUM 10 MG PO TABS: ORAL_TABLET | ORAL | 0 refills | Status: DC

## 2021-11-30 ENCOUNTER — Other Ambulatory Visit: Payer: Self-pay | Admitting: Student

## 2021-12-03 ENCOUNTER — Encounter: Payer: Self-pay | Admitting: *Deleted

## 2021-12-28 ENCOUNTER — Emergency Department (HOSPITAL_COMMUNITY)
Admission: EM | Admit: 2021-12-28 | Discharge: 2021-12-28 | Disposition: A | Discharge status: Home / Self Care | Payer: Medicaid Other | Attending: Emergency Medicine | Admitting: Emergency Medicine

## 2021-12-28 ENCOUNTER — Emergency Department (HOSPITAL_COMMUNITY): Payer: Medicaid Other

## 2021-12-28 ENCOUNTER — Encounter (HOSPITAL_COMMUNITY): Payer: Self-pay

## 2021-12-28 ENCOUNTER — Other Ambulatory Visit: Payer: Self-pay

## 2021-12-28 DIAGNOSIS — D72829 Elevated white blood cell count, unspecified: Secondary | ICD-10-CM | POA: Diagnosis not present

## 2021-12-28 DIAGNOSIS — T50901A Poisoning by unspecified drugs, medicaments and biological substances, accidental (unintentional), initial encounter: Secondary | ICD-10-CM

## 2021-12-28 DIAGNOSIS — R7309 Other abnormal glucose: Secondary | ICD-10-CM | POA: Insufficient documentation

## 2021-12-28 DIAGNOSIS — T40601A Poisoning by unspecified narcotics, accidental (unintentional), initial encounter: Secondary | ICD-10-CM | POA: Insufficient documentation

## 2021-12-28 DIAGNOSIS — R7401 Elevation of levels of liver transaminase levels: Secondary | ICD-10-CM | POA: Diagnosis not present

## 2021-12-28 DIAGNOSIS — D649 Anemia, unspecified: Secondary | ICD-10-CM | POA: Insufficient documentation

## 2021-12-28 LAB — COMPREHENSIVE METABOLIC PANEL
ALT: 46 U/L — ABNORMAL HIGH (ref 0–44)
AST: 69 U/L — ABNORMAL HIGH (ref 15–41)
Albumin: 4 g/dL (ref 3.5–5.0)
Alkaline Phosphatase: 61 U/L (ref 38–126)
Anion gap: 6 (ref 5–15)
BUN: 14 mg/dL (ref 6–20)
CO2: 26 mmol/L (ref 22–32)
Calcium: 8.6 mg/dL — ABNORMAL LOW (ref 8.9–10.3)
Chloride: 108 mmol/L (ref 98–111)
Creatinine, Ser: 0.65 mg/dL (ref 0.44–1.00)
GFR, Estimated: >60 mL/min (ref >60)
Glucose, Bld: 142 mg/dL — ABNORMAL HIGH (ref 70–99)
Potassium: 4.6 mmol/L (ref 3.5–5.1)
Sodium: 140 mmol/L (ref 135–145)
Total Bilirubin: 0.4 mg/dL (ref 0.3–1.2)
Total Protein: 7 g/dL (ref 6.5–8.1)

## 2021-12-28 LAB — RAPID URINE DRUG SCREEN, HOSP PERFORMED
Amphetamines: NOT DETECTED
Barbiturates: NOT DETECTED
Benzodiazepines: NOT DETECTED
Cocaine: NOT DETECTED
Opiates: NOT DETECTED
Tetrahydrocannabinol: POSITIVE — AB

## 2021-12-28 LAB — CBC
HCT: 34.1 % — ABNORMAL LOW (ref 36.0–46.0)
Hemoglobin: 11 g/dL — ABNORMAL LOW (ref 12.0–15.0)
MCH: 30.2 pg (ref 26.0–34.0)
MCHC: 32.3 g/dL (ref 30.0–36.0)
MCV: 93.7 fL (ref 80.0–100.0)
Platelets: 257 10*3/uL (ref 150–400)
RBC: 3.64 MIL/uL — ABNORMAL LOW (ref 3.87–5.11)
RDW: 14.9 % (ref 11.5–15.5)
WBC: 28.9 10*3/uL — ABNORMAL HIGH (ref 4.0–10.5)
nRBC: 0 % (ref 0.0–0.2)

## 2021-12-28 LAB — I-STAT BETA HCG BLOOD, ED (MC, WL, AP ONLY): I-stat hCG, quantitative: <5 m[IU]/mL (ref <5)

## 2021-12-28 LAB — ETHANOL: Alcohol, Ethyl (B): <10 mg/dL (ref <10)

## 2021-12-28 LAB — ACETAMINOPHEN LEVEL: Acetaminophen (Tylenol), Serum: <10 ug/mL — ABNORMAL LOW (ref 10–30)

## 2021-12-28 LAB — SALICYLATE LEVEL: Salicylate Lvl: <7 mg/dL — ABNORMAL LOW (ref 7.0–30.0)

## 2021-12-28 MED ORDER — NALOXONE HCL 0.4 MG/ML IJ SOLN
INTRAMUSCULAR | Status: AC
  Filled 2021-12-28: qty 1

## 2021-12-28 MED ORDER — DIPHENHYDRAMINE HCL 25 MG PO CAPS
25.0000 mg | ORAL_CAPSULE | Freq: Once | ORAL | Status: AC
  Administered 2021-12-28: 25 mg via ORAL
  Filled 2021-12-28: qty 1

## 2021-12-28 MED ORDER — SODIUM CHLORIDE 0.9 % IV BOLUS
500.0000 mL | Freq: Once | INTRAVENOUS | Status: AC
  Administered 2021-12-28: 500 mL via INTRAVENOUS

## 2021-12-28 MED ORDER — ONDANSETRON HCL 4 MG/2ML IJ SOLN
4.0000 mg | Freq: Once | INTRAMUSCULAR | Status: AC
  Administered 2021-12-28: 4 mg via INTRAVENOUS
  Filled 2021-12-28: qty 2

## 2021-12-28 MED ORDER — NALOXONE HCL 0.4 MG/ML IJ SOLN
0.4000 mg | Freq: Once | INTRAMUSCULAR | Status: AC
  Administered 2021-12-28: 0.4 mg via INTRAVENOUS

## 2021-12-28 MED ORDER — SODIUM CHLORIDE 0.9 % IV BOLUS
1000.0000 mL | Freq: Once | INTRAVENOUS | Status: AC
  Administered 2021-12-28: 1000 mL via INTRAVENOUS

## 2021-12-28 MED ORDER — DICYCLOMINE HCL 10 MG PO CAPS
20.0000 mg | ORAL_CAPSULE | Freq: Once | ORAL | Status: AC
  Administered 2021-12-28: 20 mg via ORAL
  Filled 2021-12-28: qty 2

## 2021-12-28 MED ORDER — METOCLOPRAMIDE HCL 5 MG/ML IJ SOLN
10.0000 mg | Freq: Once | INTRAMUSCULAR | Status: AC
  Administered 2021-12-28: 10 mg via INTRAVENOUS
  Filled 2021-12-28: qty 2
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
## 2021-12-28 NOTE — ED Notes (Signed)
Patient tolerated the ambulating well. Mentioned slight discomfort in her leg, steady gait. Also mentioned a slight headache but patient states that it usually happens when she is dehydrated. Sipping on liquids now.

## 2021-12-28 NOTE — ED Notes (Signed)
Pt ambulated with steady gait. Tolerated well.
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
## 2021-12-28 NOTE — ED Notes (Signed)
AVS provided to and discussed with patient. Pt verbalizes understanding of discharge instructions and denies any questions or concerns at this time. Pt has ride home. Pt ambulated out of department independently with steady gait.  

## 2021-12-28 NOTE — ED Notes (Signed)
Pt is alert, awake, talking on phone. Denies current needs.

## 2021-12-28 NOTE — ED Provider Notes (Signed)
[facility]-EMERGENCY DEPT Provider Note   CSN: [id number] Arrival date & time: 12/28/21  0335     History  Chief Complaint  Patient presents with   Drug Overdose    Brianna Maddox is a 31 y.o. female.  HPI  Patient with out medical history presents with complaints of of an overdose.  Patient states she is not exactly what happened but she states that today she was took some opiates and believes that she overdose, states that this was not intentional she was recreation.  She also notes that she drinks some alcohol today, states she drank a few truly's, states that she does not drink every day, she has never been hospitalized for alcohol withdrawals, denies any other illicit drug use.  She states that her boyfriend had to do CPR on her and give her an EpiPen, she states now she is having a severe headache with nausea and vomiting, she has no other complaints, she denies any chest pain shortness of breath stomach pains, denies any pain in the upper or lower extremities no neck or back pain.    Home Medications Prior to Admission medications   Medication Sig Start Date End Date Taking? Authorizing Provider  calcium carbonate (TUMS - DOSED IN MG ELEMENTAL CALCIUM) 500 MG chewable tablet Chew 1,000 mg by mouth 3 (three) times daily as needed for indigestion or heartburn.   Yes [provider]  escitalopram (LEXAPRO) 10 MG tablet Take 1 tablet (10 mg total) by mouth daily. 04/29/21  Yes Shelby Mattocks, DO  ibuprofen (ADVIL) 800 MG tablet Take 1 tablet (800 mg total) by mouth every 8 (eight) hours as needed. 04/21/20  Yes Taam-Akelman, Griselda Miner, MD  levocetirizine (XYZAL) 5 MG tablet TAKE 1 TABLET BY MOUTH DAILY FOR ALLERGIES Patient taking differently: Take 5 mg by mouth every evening. 11/12/21  Yes Shelby Mattocks, DO  montelukast (SINGULAIR) 10 MG tablet TAKE 1 TABLET BY MOUTH DAILY FOR ALLERGIES *CALL FOR FOLLOW UP APPT FOR FURTHER REFILLS* Patient taking  differently: Take 10 mg by mouth at bedtime. 12/02/21  Yes Shelby Mattocks, DO  clobetasol (TEMOVATE) 0.05 % external solution Apply 1 application topically 2 (two) times daily. Patient not taking: Reported on 12/28/2021 08/28/20   [name], NP  ferrous sulfate (FERROUSUL) 325 (65 FE) MG tablet Take 1 tablet (325 mg total) by mouth daily with breakfast. Patient not taking: Reported on 12/28/2021 04/21/20   Rande Brunt, MD  ondansetron (ZOFRAN ODT) 4 MG disintegrating tablet Take 1 tablet (4 mg total) by mouth every 8 (eight) hours as needed for nausea or vomiting. Patient not taking: Reported on 12/28/2021 04/21/21   Theron Arista, PA-C  senna-docusate (SENOKOT-S) 8.6-50 MG tablet Take 2 tablets by mouth daily. Patient not taking: Reported on 12/28/2021 04/22/20   Rande Brunt, MD  silver sulfADIAZINE (SILVADENE) 1 % cream Apply 1 application topically daily. Patient not taking: Reported on 12/28/2021 05/16/20   Maxwell Caul, PA-C      Allergies    Patient has no known allergies.    Review of Systems   Review of Systems  Constitutional:  Negative for chills and fever.  Respiratory:  Negative for shortness of breath.   Cardiovascular:  Negative for chest pain.  Gastrointestinal:  Positive for nausea and vomiting. Negative for abdominal pain.  Neurological:  Positive for headaches.  Psychiatric/Behavioral:  Negative for self-injury and suicidal ideas.     Physical Exam Updated Vital Signs BP (!) 94/58   Pulse  84   Temp 98.3 F (36.8 C) (Oral)   Resp 12   Ht 5\' 6"  (1.676 m)   Wt 54.4 kg   SpO2 96%   BMI 19.37 kg/m  Physical Exam Vitals and nursing note reviewed.  Constitutional:      General: She is not in acute distress.    Appearance: She is not ill-appearing.  HENT:     Head: Normocephalic and atraumatic.     Nose: No congestion.     Mouth/Throat:     Mouth: Mucous membranes are moist.     Pharynx: Oropharynx is clear.     Comments: No trismus no  torticollis no oral trauma noted. Eyes:     Extraocular Movements: Extraocular movements intact.     Conjunctiva/sclera: Conjunctivae normal.  Cardiovascular:     Rate and Rhythm: Normal rate and regular rhythm.     Pulses: Normal pulses.     Heart sounds: No murmur heard.    No friction rub. No gallop.  Pulmonary:     Effort: No respiratory distress.     Breath sounds: No wheezing, rhonchi or rales.  Abdominal:     Palpations: Abdomen is soft.     Tenderness: There is no abdominal tenderness. There is no right CVA tenderness or left CVA tenderness.     Comments: Soft nontender nonsurgical abdomen  Skin:    General: Skin is warm and dry.  Neurological:     Mental Status: She is alert.     Comments: Cranial nerves II through XII grossly intact no difficulty with word finding, following two-step commands, no unilateral weakness present.  Psychiatric:        Mood and Affect: Mood normal.     Comments: Alert and orient x4, denies suicidal homicidal ideations, is not responding to internal stimuli and she is responding appropriately.     ED Results / Procedures / Treatments   Labs (all labs ordered are listed, but only abnormal results are displayed) Labs Reviewed  COMPREHENSIVE METABOLIC PANEL - Abnormal; Notable for the following components:      Result Value   Glucose, Bld 142 (*)    Calcium 8.6 (*)    AST 69 (*)    ALT 46 (*)    All other components within normal limits  SALICYLATE LEVEL - Abnormal; Notable for the following components:   Salicylate Lvl <7.0 (*)    All other components within normal limits  ACETAMINOPHEN LEVEL - Abnormal; Notable for the following components:   Acetaminophen (Tylenol), Serum <10 (*)    All other components within normal limits  CBC - Abnormal; Notable for the following components:   WBC 28.9 (*)    RBC 3.64 (*)    Hemoglobin 11.0 (*)    HCT 34.1 (*)    All other components within normal limits  RAPID URINE DRUG SCREEN, HOSP PERFORMED  - Abnormal; Notable for the following components:   Tetrahydrocannabinol POSITIVE (*)    All other components within normal limits  ETHANOL  I-STAT BETA HCG BLOOD, ED (MC, [facility], AP ONLY)    EKG EKG Interpretation  Date/Time:  Saturday December 28 2021 04:18:48 EDT Ventricular Rate:  95 PR Interval:  168 QRS Duration: 108 QT Interval:  383 QTC Calculation: 482 R Axis:   87 Text Interpretation: Sinus rhythm Borderline T abnormalities, anterior leads Borderline prolonged QT interval Confirmed by 01-28-2001 [phone]) on 12/28/2021 5:10:54 AM  Radiology CT HEAD WO CONTRAST (02/28/2022)  Result Date: 12/28/2021 CLINICAL DATA:  31 year old female found down status post CPR. EXAM: CT HEAD WITHOUT CONTRAST TECHNIQUE: Contiguous axial images were obtained from the base of the skull through the vertex without intravenous contrast. RADIATION DOSE REDUCTION: This exam was performed according to the departmental dose-optimization program which includes automated exposure control, adjustment of the mA and/or kV according to patient size and/or use of iterative reconstruction technique. COMPARISON:  None Available. FINDINGS: Brain: Normal cerebral volume. No midline shift, ventriculomegaly, mass effect, evidence of mass lesion, intracranial hemorrhage or evidence of cortically based acute infarction. Gray-white matter differentiation is within normal limits throughout the brain. Normal basilar cisterns. Vascular: No suspicious intracranial vascular hyperdensity. Skull: Negative. Sinuses/Orbits: Visualized paranasal sinuses and mastoids are clear. Other: Visualized orbits and scalp soft tissues are within normal limits. Negative visible pharynx. IMPRESSION: Normal noncontrast Head CT. Electronically Signed   By: Odessa Fleming M.D.   On: 12/28/2021 05:15   DG Abdomen Acute W/Chest  Result Date: 12/28/2021 CLINICAL DATA:  31 year old female found down, CPR in progress. EXAM: DG ABDOMEN ACUTE WITH 1 VIEW CHEST COMPARISON:  Chest  radiographs 05/16/2020. FINDINGS: PA view of the chest at 0442 hours. Lung volumes and mediastinal contours appear normal. Visualized tracheal air column is within normal limits. Both lungs appear clear. No pneumothorax or pleural effusion. Upright and supine views of the abdomen and pelvis. Non obstructed bowel gas pattern. Gas-filled but nondilated transverse colon. Small air-fluid level in the stomach. EKG leads and wires overlie the abdomen and pelvis. Other abdominal and pelvic visceral contours are within normal limits. S1 spina bifida occulta, normal variant. No osseous abnormality identified. IMPRESSION: 1. Normal bowel gas pattern, no free air. 2. No cardiopulmonary abnormality. Electronically Signed   By: Odessa Fleming M.D.   On: 12/28/2021 05:14    Procedures .Critical Care  Performed by: Carroll Sage, PA-C Authorized by: Carroll Sage, PA-C   Critical care provider statement:    Critical care time (minutes):  30   Critical care time was exclusive of:  Separately billable procedures and treating other patients   Critical care was necessary to treat or prevent imminent or life-threatening deterioration of the following conditions:  Respiratory failure   Critical care was time spent personally by me on the following activities:  Development of treatment plan with patient or surrogate, discussions with consultants, evaluation of patient's response to treatment, examination of patient, ordering and review of laboratory studies, ordering and review of radiographic studies, ordering and performing treatments and interventions, pulse oximetry, re-evaluation of patient's condition and review of old charts   I assumed direction of critical care for this patient from another provider in my specialty: no     Care discussed with: admitting provider       Medications Ordered in ED Medications  ondansetron (ZOFRAN) injection 4 mg (4 mg Intravenous Given 12/28/21 0424)  sodium chloride 0.9 %  bolus 500 mL (0 mLs Intravenous Stopped 12/28/21 0510)  metoCLOPramide (REGLAN) injection 10 mg (10 mg Intravenous Given 12/28/21 0424)  diphenhydrAMINE (BENADRYL) capsule 25 mg (25 mg Oral Given 12/28/21 0424)  dicyclomine (BENTYL) capsule 20 mg (20 mg Oral Given 12/28/21 0423)  naloxone [facility]) injection 0.4 mg (0.4 mg Intravenous Given 12/28/21 4132)    ED Course/ Medical Decision Making/ A&P Clinical Course as of 12/28/21 0656  Sat Dec 28, 2021  0647 Took some unknown opiates due to being in pain.  Boyfriend thought she coded so did CPR and he gave her epi pen Perced up with narcan with  ED She has bradyd down and gotten hypotensive, repeat narcan here Needs to obs for several hours, if okay, then we can discharge, if another dose of narcan, plan to admit.  [GL]    Clinical Course User Index [GL] Victorino Dike Finis Bud, PA-C                           Medical Decision Making Amount and/or Complexity of Data Reviewed Labs: ordered. Radiology: ordered.  Risk Prescription drug management.   This patient presents to the ED for concern of overdose, this involves an extensive number of treatment options, and is a complaint that carries with it a high risk of complications and morbidity.  The differential diagnosis includes withdrawal, psychiatric emergency, metabolic derailments    Additional history obtained:  Additional history obtained from EMS External records from outside source obtained and reviewed including N/A   Co morbidities that complicate the patient evaluation  Polysubstance dependency  Social Determinants of Health:  N/A    Lab Tests:  I Ordered, and personally interpreted labs.  The pertinent results include: CBC shows leukocytosis of 20.9, hemoglobin is 11, CMP shows glucose of 142 calcium 8.6 AST 69 ALT 46, rapid urine drug screen positive for cannabinoids, acetaminophen, slight level unremarkable   Imaging Studies ordered:  I ordered imaging studies including  CT head, acute chest abdomen I independently visualized and interpreted imaging which showed  I agree with the radiologist interpretation   Cardiac Monitoring:  The patient was maintained on a cardiac monitor.  I personally viewed and interpreted the cardiac monitored which showed an underlying rhythm of: EKG without signs of ischemia   Medicines ordered and prescription drug management:  I ordered medication including fluids, migraine cocktail I have reviewed the patients home medicines and have made adjustments as needed  Critical Interventions:  Patient is coming hypotensive and bradypnea likely secondary due to polysubstance abuse, patient private Narcan.  She has improved BP has improved as well as respiratory rate.   Reevaluation:  Presents after an overdose, she has a benign physical exam, will obtain CT imaging of head rule out of head bleed and/or an cranial mass, will also obtain acute chest abdomen for rule out of rib fractures as well as bowel obstruction.  Provide patient migraine cocktail and reassess  Patient is reassessed, she is resting comfortably, patient states that she is feeling better, but now that she was only breathing about 9 times a minute she is becoming more hypotensive, will provide her with Narcan and reassess.  Patient was given Narcan blood pressure has improved and is breathing normally with pressure between 10 and 12 times per minute.  We will continue to monitor.    Consultations Obtained:  N/A    Test Considered:  CT and pelvis-deferred as my suspicion for intra-abdominal abnormality is very low at this time, she has nonsurgical abdomen, vital signs are reassuring.    Rule out I have low suspicion for intracranial head bleed/CVA no focal deficit on my exam CT head is negative for acute findings.  I have low suspicion for systemic infection as patient nontoxic-appearing no source of infection present my exam.  She was noted to be  tachycardic and has a white count of 28,000 but I suspect this is more acute phase reactant from polysubstance use as well as nausea and vomiting.  I have low suspicion for pneumothorax lung sounds are clear bilaterally, chest x-ray is unremarkable.  I have low  suspicion for bowel obstruction as abdomen soft nontender still passing gas having bowel movements x-rays negative for acute findings.  Low suspicion for psychiatric emergency she denies suicidal homicidal ideations, she does not respond to internal stimuli.    Dispostion and problem list Due to shift change patient [facility]   Continue to monitor patient, if patient requires additional dose of Narcan would recommend admission for further observation.              Final Clinical Impression(s) / ED Diagnoses Final diagnoses:  Accidental overdose, initial encounter    Rx / DC Orders ED Discharge Orders     None         Carroll SageFaulkner, Shavell Nored J, PA-C 12/28/21 0656    Sabas SousBero, Michael M, MD 12/28/21 [phone]

## 2021-12-28 NOTE — Discharge Instructions (Signed)
Please return if you develop any worsening symptoms. I encourage drinking plenty of fluids at home.

## 2022-01-03 ENCOUNTER — Ambulatory Visit (INDEPENDENT_AMBULATORY_CARE_PROVIDER_SITE_OTHER): Payer: Medicaid Other | Admitting: Family Medicine

## 2022-01-03 ENCOUNTER — Encounter: Payer: Self-pay | Admitting: Family Medicine

## 2022-01-03 VITALS — BP 96/57 | HR 86 | Ht 66.0 in | Wt 120.0 lb

## 2022-01-03 DIAGNOSIS — R5383 Other fatigue: Secondary | ICD-10-CM

## 2022-01-03 DIAGNOSIS — F419 Anxiety disorder, unspecified: Secondary | ICD-10-CM | POA: Diagnosis not present

## 2022-01-03 DIAGNOSIS — D649 Anemia, unspecified: Secondary | ICD-10-CM

## 2022-01-03 DIAGNOSIS — R634 Abnormal weight loss: Secondary | ICD-10-CM | POA: Diagnosis not present

## 2022-01-03 MED ORDER — HYDROXYZINE HCL 25 MG PO TABS: 25.0000 mg | ORAL_TABLET | Freq: Three times a day (TID) | ORAL | 0 refills | Status: DC | PRN

## 2022-01-03 NOTE — Patient Instructions (Addendum)
You are going through a lot right now, so we are going to focus on the things that we can help you with.   - Below are medicaid therapy resources and I want you to reach out and see if you are able to find a reasonable therapist  - Let's try a medication called hydroxyzine to help with your anxiety for now, but we will want to have close follow-up to see if it helps at all.  - We are going to get some basic labs to check your anemia, iron levels, vitamin b12, and thryoid  - Make sure to start taking a multivitamin (recommend prenatals given your age and lack of birth control)  - Let [place] know if you decide what you would like to do for contraception and we can talk further and we do most of our own procedures in the office    Therapy and Counseling Resources Most providers on this list will take Medicaid. Patients with commercial insurance or Medicare should contact their insurance company to get a list of in network providers.  [facility] (spanish speaking therapist available)(habla espanol)(take medicare and medicaid)  [address], [place], [place] [address], [email or web address] [phone]  [facility] [address] [place], [place] [address] [phone]  [facility]   [address], [address] [place], [place] [address]      [phone]  [facility] (spanish available) [address]  [place], [place] [address] [phone]  [facility] (take [facility] and medicare) [address], [address]  [place], [place] [address]       [phone]     [facility] (virtual only) [phone]  [facility] [address] [place], [place] [phone]  [facility]:  [address] [place] [phone]  [facility]:  [address] [place] [place] [phone]  [facility] (under & uninsured) [address]   [place] [phone]    [email or web address]    [facility] [address]  [place]    [phone]  [facility] -[address]     Phone:  [phone]     [facility]-  [address]  [phone]   [facility] [address]      [place], [place] [phone]  [facility]: [address] [place], [place], [place]  [phone]   [facility] (Spanish therapist) [email or web address]  [address] [place]  [address]   [place] [address]    [phone]    [facility]   [address],  [place], [place]  [phone]   [facility] [address] [facility]  [address] [place]  [phone]  [facility]  [address] [place], [place]        [phone]  Open Access/Walk In Clinic under & uninsured  [facility]  [address] [place], [place] [phone] Crisis [phone]  [facility] [place],  (Spanish)   [address], [place]: [phone]) 8:30 - 12; 1 - 2:30  [facility] [employer],  [address], [place]    ([phone]):8:30 - 12; 2 - 3PM  [facility] [employer],  [address],  [place]; [phone]):   Mon - Fri 8 AM - 5 PM  [facility] [address] [place]  MWF 12:30 to 3:00 or call to schedule an appointment  [phone]  Specific Provider options Psychology Today  [email or web address] click on find a therapist  enter your zip code left side and select or tailor a therapist for your specific need.   [facility] Provider Directory [email or web address]  (Medicaid)   Follow all drop down to find a provider  Social Support program [facility] [place] [phone] or Photo[employer].pl [address], [place], [place] Recovery  support and educational   24- Hour Availability:   [facility]  [address] [place], [place] [phone] Crisis [phone]  [facility] [phone]  [place] [facility]  [phone]   [facility]  [phone] (after hours)  Therapeutic Alternative/Mobile Crisis   [phone]  [place] National Suicide Hotline  6462998022 Len Childs)  Call 911 or go to emergency room  [facility]  [phone]);  [place] and [employer]  [phone]); [place], [place], [place], [place], [place], [place], [place]

## 2022-01-03 NOTE — Progress Notes (Signed)
    SUBJECTIVE:   CHIEF COMPLAINT / HPI:   ER follow-up - Patient presented to the ER after presumed narcotic overdose, suspect fentanyl - Patient reports ingestion of unknown pill from friend to help calm her panic attack - Does have a history of substance use but does not use regularly - Is willing to get Narcan to have around in case of recurrence - Denies any issues with opiate dependence, but is interested in possibly attending NA meetings to obtain a support group  Anxiety/panic attacks, difficult social situation - Patient recently underwent difficult divorce - Is currently single mother to 2 children with inconsistent assistance from their father - Has significant history of anxiety previously treated with SSRIs, therapy, bzd's - 2 weeks prior, had panic attack that lasted 6 hours - Was following with therapy, but prior therapist is now too expensive  Contraception counseling - Patient is currently in a relationship - Has 2 children, not currently desiring any at this time - Uses condoms but has no other contraception  Unintentional weight loss - In the setting of increased stress as a newly single mom who is divorced with inconsistent support - Does have issues with bowel upset, heat and cold intolerance, inattentiveness (possibly related to ADHD) - Currently vegetarian lifestyle, lacking protein intake  Dizziness - Has been present for quite some times - Appears to be more related to positional changes (bending down and then back up) - Has a history of low blood pressure, unclear if related to onset of the symptoms   PERTINENT  PMH / PSH: Reviewed  OBJECTIVE:   BP (!) 96/57   Pulse 86   Ht 5\' 6"  (1.676 m)   Wt 120 lb (54.4 kg)   LMP 12/11/2021   SpO2 100%   BMI 19.37 kg/m   General: NAD, well-appearing, well-nourished Respiratory: No respiratory distress, breathing comfortably, able to speak in full sentences Skin: warm and dry, no rashes noted on exposed  skin Psych: Appropriate affect and mood  ASSESSMENT/PLAN:   Weight loss, non-intentional Has lost 40 pounds unintentionally in the last year.  Does have social situations that would contribute, but concern for organic causes including thyroid, substance use, GI conditions. - TSH with reflex T4 - B12 - CBC with differential -Iron panel  Anxious mood Accompanied by severe panic attacks that limit patient's function ability. - Medicaid therefore resources provided - Continue escitalopram - Hydroxyzine 25 mg 3 times daily as needed - Follow-up in 2 weeks   Recent substance use - Recommended obtaining Narcan to have around - Encourage patient to attend NA meeting to see if there was any utility for support  Fatigue with history of anemia Given vegetarian lifestyle, recommended increasing protein intake and iron rich foods. - CBC with differential - Iron panel today - TSH with reflex T4 - B12  Karmen Altamirano, DO [facility]

## 2022-01-03 NOTE — Assessment & Plan Note (Signed)
Has lost 40 pounds unintentionally in the last year.  Does have social situations that would contribute, but concern for organic causes including thyroid, substance use, GI conditions. - TSH with reflex T4 - B12 - CBC with differential -Iron panel

## 2022-01-03 NOTE — Assessment & Plan Note (Signed)
Accompanied by severe panic attacks that limit patient's function ability. - Medicaid therefore resources provided - Continue escitalopram - Hydroxyzine 25 mg 3 times daily as needed - Follow-up in 2 weeks

## 2022-01-04 LAB — CBC WITH DIFFERENTIAL/PLATELET
Basophils Absolute: 0.1 10*3/uL (ref 0.0–0.2)
Basos: 1 %
EOS (ABSOLUTE): 0.2 10*3/uL (ref 0.0–0.4)
Eos: 3 %
Hematocrit: 37.5 % (ref 34.0–46.6)
Hemoglobin: 12.4 g/dL (ref 11.1–15.9)
Immature Grans (Abs): 0 10*3/uL (ref 0.0–0.1)
Immature Granulocytes: 0 %
Lymphocytes Absolute: 1.9 10*3/uL (ref 0.7–3.1)
Lymphs: 29 %
MCH: 29.8 pg (ref 26.6–33.0)
MCHC: 33.1 g/dL (ref 31.5–35.7)
MCV: 90 fL (ref 79–97)
Monocytes Absolute: 0.5 10*3/uL (ref 0.1–0.9)
Monocytes: 8 %
Neutrophils Absolute: 3.8 10*3/uL (ref 1.4–7.0)
Neutrophils: 59 %
Platelets: 255 10*3/uL (ref 150–450)
RBC: 4.16 x10E6/uL (ref 3.77–5.28)
RDW: 14.1 % (ref 11.7–15.4)
WBC: 6.5 10*3/uL (ref 3.4–10.8)

## 2022-01-04 LAB — IRON,TIBC AND FERRITIN PANEL
Ferritin: 10 ng/mL — ABNORMAL LOW (ref 15–150)
Iron Saturation: 19 % (ref 15–55)
Iron: 88 ug/dL (ref 27–159)
Total Iron Binding Capacity: 475 ug/dL — ABNORMAL HIGH (ref 250–450)
UIBC: 387 ug/dL (ref 131–425)

## 2022-01-04 LAB — TSH RFX ON ABNORMAL TO FREE T4: TSH: 1.41 u[IU]/mL (ref 0.450–4.500)

## 2022-01-04 LAB — VITAMIN B12: Vitamin B-12: 730 pg/mL (ref 232–1245)

## 2022-01-10 ENCOUNTER — Other Ambulatory Visit: Payer: Self-pay | Admitting: Family Medicine

## 2022-01-11 ENCOUNTER — Other Ambulatory Visit: Payer: Self-pay | Admitting: Student

## 2022-01-11 DIAGNOSIS — F321 Major depressive disorder, single episode, moderate: Secondary | ICD-10-CM

## 2022-01-29 ENCOUNTER — Ambulatory Visit: Payer: Medicaid Other | Admitting: Family Medicine

## 2022-03-05 ENCOUNTER — Other Ambulatory Visit: Payer: Self-pay | Admitting: Student

## 2022-03-11 ENCOUNTER — Ambulatory Visit (HOSPITAL_COMMUNITY): Payer: Medicaid Other | Admitting: Mental Health

## 2022-03-24 ENCOUNTER — Other Ambulatory Visit: Payer: Self-pay | Admitting: Student

## 2022-03-25 ENCOUNTER — Encounter (HOSPITAL_COMMUNITY): Payer: Medicaid Other | Admitting: Mental Health

## 2022-03-25 ENCOUNTER — Encounter (HOSPITAL_COMMUNITY): Payer: Self-pay

## 2022-03-29 ENCOUNTER — Other Ambulatory Visit: Payer: Self-pay | Admitting: Internal Medicine

## 2022-06-12 ENCOUNTER — Ambulatory Visit (INDEPENDENT_AMBULATORY_CARE_PROVIDER_SITE_OTHER): Payer: Medicaid Other | Admitting: Family Medicine

## 2022-06-12 ENCOUNTER — Encounter: Payer: Self-pay | Admitting: Family Medicine

## 2022-06-12 VITALS — BP 113/74 | HR 73 | Ht 66.0 in | Wt 116.2 lb

## 2022-06-12 DIAGNOSIS — R634 Abnormal weight loss: Secondary | ICD-10-CM | POA: Diagnosis present

## 2022-06-12 DIAGNOSIS — F419 Anxiety disorder, unspecified: Secondary | ICD-10-CM

## 2022-06-12 DIAGNOSIS — R109 Unspecified abdominal pain: Secondary | ICD-10-CM | POA: Diagnosis not present

## 2022-06-12 MED ORDER — LORAZEPAM 0.5 MG PO TABS: 0.5000 mg | ORAL_TABLET | Freq: Two times a day (BID) | ORAL | 0 refills | Status: AC | PRN

## 2022-06-12 MED ORDER — MIRTAZAPINE 7.5 MG PO TABS: 7.5000 mg | ORAL_TABLET | Freq: Every day | ORAL | 0 refills | Status: DC

## 2022-06-12 MED ORDER — MONTELUKAST SODIUM 10 MG PO TABS: ORAL_TABLET | ORAL | 0 refills | Status: DC

## 2022-06-12 NOTE — Patient Instructions (Addendum)
I am sending in a referral to GI doctor for your stomach concerns.      Therapy and Counseling Resources Most providers on this list will take Medicaid. Patients with commercial insurance or Medicare should contact their insurance company to get a list of in network providers.  [employer] (takes children) Location 1: [address], [place], [place] [address] Location 2: [place], [place] [address] [place] ([place] speaking therapist available)(habla espanol)(take medicare and medicaid)  [place], [place], [place] [address], [place] [email or web address] [phone]  [facility]Psychiatry and Counseling [address]. [place], [place] [address] [place], [place], [place] [address]      [phone]  [place] (spanish available) [place], [place] [address] [place] (take [facility] and medicare) [address], [place], [place] [address]       [phone]     [place] (virtual only) [phone]  Jinny Blossom [facility] [address] [place], [place]  [facility]:  [place]. [place] [phone]  [facility]:  [place] [address] Rosie Fate [phone]  [facility] (under & uninsured) [address], [place] [phone]    [email or web address]    [place] [address]  [place]    [phone]  [facility] [place]     Phone:  [phone]     [place]  [place] [address]      [place], [place] ext [place]: [place], [place], [place]   [place] ([place]  therapist) [email or web address]  [place] [address]   [place] [address]    [phone]    [facility]   [address],  [place], [place]   [place]  [place]  [facility]  [address] [place], [place]        [phone]  Open Access/Walk In Clinic under & uninsured  [facility]  [address] [place], [place] [facility] Crisis [phone]  [facility] [place],  ([place])   [place], [place]: [phone]) 8:30 - 12; 1 - 2:30  [facility] [place],  [place], [place]    ([phone]):8:30 - 12; 2 - 3PM  [facility] [employer],  [address]  [place]; [phone]):   Mon - Fri 8 AM - 5 PM  [facility] [place]  MWF 12:30 to 3:00 or call to schedule an appointment  [phone]  Specific Provider options Psychology Today  [email or web address] click on find a therapist  enter your zip code left side and select or tailor a therapist for your specific need.   [facility] Provider Directory [email or web address]  (Medicaid)   Follow all drop down to find a provider  [place] or [email or web address] [address], [place], [place] Recovery support and educational   24- Hour Availability:   [facility]  [place],  [place] [phone] Crisis [phone]  [facility] [phone]  [facility] Crisis Service  [phone]   [facility] Crisis Services  [phone] (after hours)  Therapeutic Alternative/Mobile Crisis   [phone]  [place] National Suicide Hotline  [phone] Len Childs)  Call 911 or go to emergency room  [facility]  [phone]);  [place] and [employer]  [phone]); [place], [place], [place], [place], [place], [place], [place]

## 2022-06-12 NOTE — Progress Notes (Signed)
    SUBJECTIVE:   CHIEF COMPLAINT / HPI:   Unintentional Weight Loss  - Loss of about 4lbs since last visit (weighed 112lbs at her gyn office a few weeks - lower than today's visit) - Causes patient significant mental distress - Has some more GI symptoms since stopping Lexapro (see below) which contributes to her decrease appetite  Mental Health - Did not have her Lexapro prescription going on vacation - Inadvertently quit cold [place] and did not resume her medication afterwards - Is having some severe anxiety a lot of panic attacks - Some issues with controlling her anger more recently as well - Has had panic attacks that have lasted hours and left her incapable of doing tasks  Stomach pains - Went to gyn to make sure it wasn't that and her testing was negative - Seeing distension and feeling stabbing pain - Problems seem to be related to eating only  - 15 minutes after eating is nauseous or having pain - Feels better with eating vegetables, hasn't noticed issues when eating "healthy" - Issues are with eggs, dairy products, carbs - Sometimes has pain with laying on her right side - Bowel movements are inconsistent and sometimes feels constipated despite using bathroom - Worse with relation to her periods  PERTINENT  PMH / PSH: Reviewed  OBJECTIVE:   BP 113/74   Pulse 73   Ht 5\' 6"  (1.676 m)   Wt 116 lb 3.2 oz (52.7 kg)   LMP 05/27/2022   SpO2 100%   BMI 18.76 kg/m   General: NAD, well-appearing, well-nourished Respiratory: No respiratory distress, breathing comfortably, able to speak in full sentences Skin: warm and dry, no rashes noted on exposed skin Psych: Appropriate affect and mood  ASSESSMENT/PLAN:   Weight loss, non-intentional  Abdominal pain Continues to have unintentional weight loss that is distressing for the patient.  Given the more recent GI symptom development, do feel that evaluation by GI might be applicable.  Do wonder if there is some food  intolerance playing into this or an IBS picture versus gallstones. - Recommend trial of Lactaid - Keep a food and symptom diary - Referral to GI placed, possible colonoscopy - Can consider Linzess if feeling IBS with constipation more likely  Anxious mood Patient was unable to get in with therapist and needs the resources list again.  Given that she is off of Lexapro and does not want to resume it at this time, will need further pharmacologic intervention to help assist patient.  With the holidays coming up, patient is more concerned about more severe panic attacks and the significant life changes that have happened in the last year. - Medicaid therapy resources list provided - Trial of mirtazapine 7.5 mg to assist with depressive symptoms and possibly stimulate appetite - Ativan 0.5 mg twice daily as needed for severe panic attacks (temporary prescription until symptoms stable)     05/29/2022, DO [place]

## 2022-06-12 NOTE — Assessment & Plan Note (Signed)
Patient was unable to get in with therapist and needs the resources list again.  Given that she is off of Lexapro and does not want to resume it at this time, will need further pharmacologic intervention to help assist patient.  With the holidays coming up, patient is more concerned about more severe panic attacks and the significant life changes that have happened in the last year. - Medicaid therapy resources list provided - Trial of mirtazapine 7.5 mg to assist with depressive symptoms and possibly stimulate appetite - Ativan 0.5 mg twice daily as needed for severe panic attacks (temporary prescription until symptoms stable)

## 2022-06-12 NOTE — Assessment & Plan Note (Signed)
Continues to have unintentional weight loss that is distressing for the patient.  Given the more recent GI symptom development, do feel that evaluation by GI might be applicable.  Do wonder if there is some food intolerance playing into this or an IBS picture versus gallstones. - Recommend trial of Lactaid - Keep a food and symptom diary - Referral to GI placed, possible colonoscopy - Can consider Linzess if feeling IBS with constipation more likely

## 2022-06-27 ENCOUNTER — Ambulatory Visit (HOSPITAL_COMMUNITY)
Admission: RE | Admit: 2022-06-27 | Discharge: 2022-06-27 | Disposition: A | Discharge status: Home / Self Care | Payer: Medicaid Other | Source: Ambulatory Visit | Attending: Family Medicine | Admitting: Family Medicine

## 2022-06-27 ENCOUNTER — Telehealth: Payer: Self-pay

## 2022-06-27 ENCOUNTER — Ambulatory Visit (INDEPENDENT_AMBULATORY_CARE_PROVIDER_SITE_OTHER): Payer: Medicaid Other | Admitting: Family Medicine

## 2022-06-27 ENCOUNTER — Emergency Department (HOSPITAL_COMMUNITY)
Admission: EM | Admit: 2022-06-27 | Discharge: 2022-06-28 | Disposition: A | Discharge status: Home / Self Care | Payer: Medicaid Other | Attending: Student | Admitting: Student

## 2022-06-27 VITALS — BP 100/62 | HR 107 | Wt 111.4 lb

## 2022-06-27 DIAGNOSIS — K5900 Constipation, unspecified: Secondary | ICD-10-CM

## 2022-06-27 DIAGNOSIS — J45909 Unspecified asthma, uncomplicated: Secondary | ICD-10-CM | POA: Insufficient documentation

## 2022-06-27 DIAGNOSIS — F419 Anxiety disorder, unspecified: Secondary | ICD-10-CM | POA: Insufficient documentation

## 2022-06-27 DIAGNOSIS — R1030 Lower abdominal pain, unspecified: Secondary | ICD-10-CM | POA: Insufficient documentation

## 2022-06-27 DIAGNOSIS — R109 Unspecified abdominal pain: Secondary | ICD-10-CM | POA: Diagnosis present

## 2022-06-27 LAB — CBC WITH DIFFERENTIAL/PLATELET
Abs Immature Granulocytes: 0.01 10*3/uL (ref 0.00–0.07)
Basophils Absolute: 0.1 10*3/uL (ref 0.0–0.1)
Basophils Relative: 1 %
Eosinophils Absolute: 0.1 10*3/uL (ref 0.0–0.5)
Eosinophils Relative: 2 %
HCT: 35.2 % — ABNORMAL LOW (ref 36.0–46.0)
Hemoglobin: 12 g/dL (ref 12.0–15.0)
Immature Granulocytes: 0 %
Lymphocytes Relative: 51 %
Lymphs Abs: 2.7 10*3/uL (ref 0.7–4.0)
MCH: 31.3 pg (ref 26.0–34.0)
MCHC: 34.1 g/dL (ref 30.0–36.0)
MCV: 91.7 fL (ref 80.0–100.0)
Monocytes Absolute: 0.3 10*3/uL (ref 0.1–1.0)
Monocytes Relative: 6 %
Neutro Abs: 2.1 10*3/uL (ref 1.7–7.7)
Neutrophils Relative %: 40 %
Platelets: 207 10*3/uL (ref 150–400)
RBC: 3.84 MIL/uL — ABNORMAL LOW (ref 3.87–5.11)
RDW: 13.5 % (ref 11.5–15.5)
WBC: 5.3 10*3/uL (ref 4.0–10.5)
nRBC: 0 % (ref 0.0–0.2)

## 2022-06-27 LAB — COMPREHENSIVE METABOLIC PANEL
ALT: 25 U/L (ref 0–44)
AST: 23 U/L (ref 15–41)
Albumin: 3.9 g/dL (ref 3.5–5.0)
Alkaline Phosphatase: 54 U/L (ref 38–126)
Anion gap: 9 (ref 5–15)
BUN: 5 mg/dL — ABNORMAL LOW (ref 6–20)
CO2: 26 mmol/L (ref 22–32)
Calcium: 9.4 mg/dL (ref 8.9–10.3)
Chloride: 104 mmol/L (ref 98–111)
Creatinine, Ser: 0.61 mg/dL (ref 0.44–1.00)
GFR, Estimated: >60 mL/min (ref >60)
Glucose, Bld: 97 mg/dL (ref 70–99)
Potassium: 3.6 mmol/L (ref 3.5–5.1)
Sodium: 139 mmol/L (ref 135–145)
Total Bilirubin: 0.6 mg/dL (ref 0.3–1.2)
Total Protein: 6.8 g/dL (ref 6.5–8.1)

## 2022-06-27 LAB — URINALYSIS, ROUTINE W REFLEX MICROSCOPIC
Bilirubin Urine: NEGATIVE
Glucose, UA: NEGATIVE mg/dL
Ketones, ur: 20 mg/dL — AB
Leukocytes,Ua: NEGATIVE
Nitrite: NEGATIVE
Protein, ur: NEGATIVE mg/dL
Specific Gravity, Urine: 1.017 (ref 1.005–1.030)
pH: 5 (ref 5.0–8.0)

## 2022-06-27 LAB — LIPASE, BLOOD: Lipase: 27 U/L (ref 11–51)

## 2022-06-27 LAB — I-STAT BETA HCG BLOOD, ED (MC, WL, AP ONLY): I-stat hCG, quantitative: <5 m[IU]/mL (ref <5)

## 2022-06-27 MED ORDER — LINACLOTIDE 72 MCG PO CAPS: 72.0000 ug | ORAL_CAPSULE | Freq: Every day | ORAL | 0 refills | Status: DC

## 2022-06-27 MED ORDER — ACETAMINOPHEN 325 MG PO TABS
650.0000 mg | ORAL_TABLET | Freq: Once | ORAL | Status: AC
Start: 2022-06-27 — End: 2022-06-27
  Administered 2022-06-27: 650 mg via ORAL
  Filled 2022-06-27: qty 2

## 2022-06-27 NOTE — ED Provider Triage Note (Signed)
Emergency Medicine Provider Triage Evaluation Note  Brianna Maddox , a 31 y.o. female  was evaluated in triage.  Pt complains of abdominal pain, constipation. States that it has been months since she had a full bowel movement. States she has been only passing small 'pellets' despite trying multiple OTC interventions. States she is unable to eat due to pain and nausea. Denies any vomiting. Hx c section, no other history of abdominal surgeries.  No fevers or chills.  Pain is generalized throughout her abdomen.  Review of Systems  Positive:  Negative:   Physical Exam  BP 107/72   Pulse 90   Temp 98.3 F (36.8 C) (Oral)   Resp 16   Ht 5\' 6"  (1.676 m)   Wt 50.3 kg   LMP 06/27/2022   SpO2 100%   BMI 17.92 kg/m  Gen:   Awake, no distress   Resp:  Normal effort  MSK:   Moves extremities without difficulty  Other:    Medical Decision Making  Medically screening exam initiated at 5:53 PM.  Appropriate orders placed.  Shaylynn Nulty was informed that the remainder of the evaluation will be completed by another provider, this initial triage assessment does not replace that evaluation, and the importance of remaining in the ED until their evaluation is complete.     Gavin Pound, PA-C 06/27/22 1755

## 2022-06-27 NOTE — Progress Notes (Signed)
    SUBJECTIVE:   CHIEF COMPLAINT / HPI:   Patient presents for constipation. Feels like she has to go to the restroom but will try to use the restroom and unable to have a BM. Will sometimes have pellets and a little liquid but not a good BM. Has not eatten In 24 hours. States she feels very distended and feeling nauseous. States she has lost another 5 pounds. Last normal BM about 3 months ago. Has been alernating between diarrhea and constipation. Also having a hard time passing gas. Has been doing Senna daily only tried Miralax for a couple of days. Of note, she was seen on 12/14 for abdominal pain and inconsistent bowel movements. GI consult was placed.    PERTINENT  PMH / PSH: Reviewed   OBJECTIVE:   BP 100/62   Pulse (!) 107   Wt 111 lb 6.4 oz (50.5 kg)   LMP 06/24/2022   SpO2 99%   BMI 17.98 kg/m    Physical exam General: well appearing, NAD Cardiovascular: RRR, no murmurs Lungs: CTAB. Normal WOB Abdomen: soft, non-distended, tender to palpation in lower left and right quadrants without guarding or rebound tenderness. Normal BS Skin: warm, dry. No edema  ASSESSMENT/PLAN:   Constipation Endorses ongoing constipation for weeks that sometimes alternates between diarrhea. No red flag symptoms and non acute abdomen on physical exam with tenderness in lower quandrants without guarding or rebound tenderness. Given her significant distress around this and concern for weight loss and not being able to eat, will check x ray. Low concern for acute abnormalities but will check to ensure no SBO. Likely due to IBS with pimarily constipation component. Discussed using more Miralax and also prescribed Linzess daily. GI referral already placed and gave patient the phone number to schedule an appointment. Return precautions discussed.     Cora Collum, DO [facility] Center

## 2022-06-27 NOTE — ED Triage Notes (Signed)
Pt to ED c/o generalized abdominal pain x 3 months, progressively getting worse, constipated,  reports nausea.

## 2022-06-27 NOTE — Patient Instructions (Signed)
It was great seeing you today!  Sorry you have not been feeling well!  I recommend doing MiraLAX 2 caps a day until you are able to have a soft bowel movement.  I have also prescribed Linzess to use daily to help as well.  We will likely need to increase the dose but I want to make sure you are able to tolerate it.  Be sure to call GI to schedule an appointment as well.   Please check-out at the front desk before leaving the clinic. Schedule to see your PCP in the next couple of weeks for follow up, but if you need to be seen earlier than that for any new issues we're happy to fit you in, just give [place] a call!  Visit Reminders: - Stop by the pharmacy to pick up your prescriptions  - Continue to work on your healthy eating habits and incorporating exercise into your daily life.  Feel free to call with any questions or concerns at any time, at [phone].   Take care,  Dr. Cora Collum [facility] Medicine Center

## 2022-06-27 NOTE — Telephone Encounter (Signed)
Patient calls nurse line reporting continued stomach concerns.   She reports she has not eaten in "over 24 hours" due to the inability to empty her bowels. She reports fullness and pain. She denies any fevers or distention. She has not noticed blood in her stools.   She reports she has been taking Miralax and Senna BID without relief.   She was seen recently for the same issue and was referred to GI, however she has not heard from them. I provided her with their phone number.   Patient scheduled for this afternoon for evaluation. She reports she will not be able to wait for GI to see her.

## 2022-06-28 ENCOUNTER — Other Ambulatory Visit: Payer: Self-pay

## 2022-06-28 ENCOUNTER — Encounter (HOSPITAL_COMMUNITY): Payer: Self-pay

## 2022-06-28 MED ORDER — DICYCLOMINE HCL 20 MG PO TABS: 20.0000 mg | ORAL_TABLET | Freq: Two times a day (BID) | ORAL | 0 refills | Status: AC | PRN

## 2022-06-28 MED ORDER — ONDANSETRON 4 MG PO TBDP
4.0000 mg | ORAL_TABLET | Freq: Once | ORAL | Status: AC
Start: 2022-06-28 — End: 2022-06-28
  Administered 2022-06-28: 4 mg via ORAL
  Filled 2022-06-28: qty 1

## 2022-06-28 MED ORDER — ONDANSETRON 4 MG PO TBDP: 4.0000 mg | ORAL_TABLET | Freq: Three times a day (TID) | ORAL | 0 refills | Status: AC | PRN

## 2022-06-28 MED ORDER — MILK AND MOLASSES ENEMA
1.0000 | Freq: Once | RECTAL | Status: AC
  Administered 2022-06-28: 240 mL via RECTAL
  Filled 2022-06-28: qty 240

## 2022-06-28 NOTE — ED Notes (Signed)
Patient verbalizes understanding of d/c instructions. Opportunities for questions and answers were provided. Pt d/c from ED and wheeled to lobby by pt's boyfriend.

## 2022-06-28 NOTE — Discharge Instructions (Addendum)
Continue Miralax and Linzess as instructed by your primary care doctor. Take Zofran as needed for nausea and Bentyl as prescribed for abdominal pain or cramping. Increase your daily water intake. Try to eat foods high in fiber. Follow up with your primary care doctor for recheck in 1-2 weeks. Return for new or concerning symptoms.

## 2022-06-28 NOTE — ED Notes (Signed)
Requested ordered medication from pharmacy 
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
## 2022-06-29 DIAGNOSIS — K5900 Constipation, unspecified: Secondary | ICD-10-CM | POA: Insufficient documentation

## 2022-06-29 NOTE — Assessment & Plan Note (Addendum)
Endorses ongoing constipation for weeks that sometimes alternates between diarrhea. No red flag symptoms and non acute abdomen on physical exam with tenderness in lower quandrants without guarding or rebound tenderness. Given her significant distress around this and concern for weight loss and not being able to eat, will check x ray. Low concern for acute abnormalities but will check to ensure no SBO. Likely due to IBS with pimarily constipation component. Discussed using more Miralax and also prescribed Linzess daily. GI referral already placed and gave patient the phone number to schedule an appointment. Return precautions discussed.
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
## 2022-07-06 ENCOUNTER — Other Ambulatory Visit: Payer: Self-pay | Admitting: Family Medicine

## 2022-07-07 ENCOUNTER — Other Ambulatory Visit (INDEPENDENT_AMBULATORY_CARE_PROVIDER_SITE_OTHER): Payer: Medicaid Other

## 2022-07-07 ENCOUNTER — Encounter: Payer: Self-pay | Admitting: Gastroenterology

## 2022-07-07 ENCOUNTER — Ambulatory Visit (INDEPENDENT_AMBULATORY_CARE_PROVIDER_SITE_OTHER): Payer: Medicaid Other | Admitting: Gastroenterology

## 2022-07-07 VITALS — BP 106/68 | HR 77 | Ht 66.0 in | Wt 115.0 lb

## 2022-07-07 DIAGNOSIS — K5900 Constipation, unspecified: Secondary | ICD-10-CM | POA: Diagnosis not present

## 2022-07-07 DIAGNOSIS — R1084 Generalized abdominal pain: Secondary | ICD-10-CM

## 2022-07-07 DIAGNOSIS — R634 Abnormal weight loss: Secondary | ICD-10-CM

## 2022-07-07 DIAGNOSIS — K921 Melena: Secondary | ICD-10-CM

## 2022-07-07 NOTE — Patient Instructions (Addendum)
_______________________________________________________  If you are age 32 or older, your body mass index should be between 23-30. Your Body mass index is 18.56 kg/m. If this is out of the aforementioned range listed, please consider follow up with your Primary Care Provider.  If you are age 50 or younger, your body mass index should be between 19-25. Your Body mass index is 18.56 kg/m. If this is out of the aformentioned range listed, please consider follow up with your Primary Care Provider.   Continue Linzess, Bentyl, and Miralax.  You have been scheduled for a colonoscopy. Please follow written instructions given to you at your visit today.  Please pick up your prep supplies at the pharmacy within the next 1-3 days. If you use inhalers (even only as needed), please bring them with you on the day of your procedure.  Your provider has requested that you go to the basement level for lab work before leaving today. Press "B" on the elevator. The lab is located at the first door on the left as you exit the elevator.  The [facility] providers would like to encourage you to use [facility] to communicate with providers for non-urgent requests or questions.  Due to long hold times on the telephone, sending your provider a message by [facility] may be a faster and more efficient way to get a response.  Please allow 48 business hours for a response.  Please remember that this is for non-urgent requests.   Due to recent changes in healthcare laws, you may see the results of your imaging and laboratory studies on MyChart before your provider has had a chance to review them.  We understand that in some cases there may be results that are confusing or concerning to you. Not all laboratory results come back in the same time frame and the provider may be waiting for multiple results in order to interpret others.  Please give [place] 48 hours in order for your provider to thoroughly review all the results before contacting  the office for clarification of your results.    It was a pleasure to see you today!  Thank you for trusting me with your gastrointestinal care!    Scott E.Candis Schatz, MD

## 2022-07-07 NOTE — Progress Notes (Unsigned)
HPI : Brianna Maddox is a very pleasant 32 year old female with a history of anxiety who is referred to [place] by Lorre Nick for further evaluation and management of chronic abdominal pain, constipation and weight loss.  Patient states that she started having symptoms around August of this year.  She states that initially she thought was a gynecologic issue and so initially was evaluated by her gynecologist.  No abnormalities were found, she followed up with her primary care provider was referred to [place].  She reports having right-sided abdominal pain and abdominal distention, as well as infrequent and unsatisfactory bowel movements.  She was prescribed senna and MiraLAX, but felt like this worsened her abdominal pain. She describes having very small, pellet-like stools with significant straining, symptoms in the toilet for 20 to 30 minutes. Passage of these small stools provided little relief. On December 29, she went to the emergency department because she had gone about 6 days without a bowel movement and was having significant abdominal pain.  In the ER, she had a plain film which did not reveal any obstruction.  Digital rectal exam negative for fecal impaction.  She was given molasses enema with small amount of stool She was subsequently started on Linzess and has been taking MiraLAX as well.  She has been taking up to 4 caps of MiraLAX daily in addition to the Linzess More recently, her stools have gone from small, hard pellet-like stools to small poorly formed stools.  She has less straining, but continues to report little satisfaction with her bowel movements. She reports seeing blood in her stool on at least 1 occasion.  The patient has lost 15 pounds in the past few months unintentionally, secondary to decreased appetite and p.o. intake. Prior to August, she reports having frequent bowel movements, typically 4-5 stools per day.  Stools were formed most of the time.  She did report having issues  with crampy abdominal pain preceding bowel movements.  Patient denies any changes in her diet accompanying the change in bowel habits and pain in August.  However, she does report that she stopped taking her Lexapro around that time.  She had been on Lexapro for many years, but ran out while on vacation, and decided that she would try to discontinue it altogether.  Patient does admit that she struggles with anxiety, and this has been a particular problem in the past few months. She reports a history of pelvic floor physical therapy following a pregnancy because of symptoms of urinary incontinence and vaginal prolapse. She has never been evaluated by GI previously. No family history of inflammatory bowel disease, celiac disease or GI malignancy.   Past Medical History:  Diagnosis Date   Abscessed tooth 2016   during pregnancy, had antibiotics   Anxiety    Asthma    Benzodiazepine abuse (HCC) 04/19/2018   Headache    Lobar pneumonia (HCC) 10/30/2017   Sepsis (HCC) 10/30/2017     Past Surgical History:  Procedure Laterality Date   CESAREAN SECTION N/A 05/24/2015   Procedure: CESAREAN SECTION;  Surgeon: Philip Aspen, DO;  Location: [facility];  Service: Obstetrics;  Laterality: N/A;   Family History  Adopted: Yes  Problem Relation Age of Onset   Anxiety disorder Mother    Thyroid disease Mother    Drug abuse Mother    Social History   Tobacco Use   Smoking status: Every Day    Packs/day: 0.50    Years: 12.00    Total pack years:  6.00    Types: Cigarettes    Last attempt to quit: 08/25/2019    Years since quitting: 2.8   Smokeless tobacco: Never  Vaping Use   Vaping Use: Never used  Substance Use Topics   Alcohol use: Not Currently    Comment: rare   Drug use: Not Currently    Types: Benzodiazepines, Marijuana   Current Outpatient Medications  Medication Sig Dispense Refill   dicyclomine (BENTYL) 20 MG tablet Take 1 tablet (20 mg total) by mouth every 12 (twelve) hours as  needed (for abdominal pain/cramping). 20 tablet 0   levocetirizine (XYZAL) 5 MG tablet TAKE 1 TABLET BY MOUTH DAILY FOR ALLERGIES 30 tablet 5   linaclotide (LINZESS) 72 MCG capsule Take 1 capsule (72 mcg total) by mouth daily before breakfast. 30 capsule 0   LORazepam (ATIVAN) 0.5 MG tablet Take 1 tablet (0.5 mg total) by mouth 2 (two) times daily as needed for anxiety. 30 tablet 0   montelukast (SINGULAIR) 10 MG tablet Take once daily 90 tablet 0   ondansetron (ZOFRAN-ODT) 4 MG disintegrating tablet Take 1 tablet (4 mg total) by mouth every 8 (eight) hours as needed for nausea or vomiting. 10 tablet 0   mirtazapine (REMERON) 7.5 MG tablet TAKE 1 TABLET BY MOUTH AT BEDTIME. (Patient not taking: Reported on 07/07/2022) 90 tablet 1   No current facility-administered medications for this visit.   No Known Allergies   Review of Systems: All systems reviewed and negative except where noted in HPI.    DG Abd 1 View  Result Date: 06/28/2022 CLINICAL DATA:  Abdominal pain EXAM: ABDOMEN - 1 VIEW COMPARISON:  12/28/2021 FINDINGS: Scattered large and small bowel gas is noted. No obstructive changes are seen. No findings of constipation are noted. No free air is seen. No abnormal mass or abnormal calcifications are noted. Bony structures are within normal limits. IMPRESSION: No acute abnormality noted. Electronically Signed   By: Alcide Clever M.D.   On: 06/28/2022 00:56    Physical Exam: BP 106/68   Pulse 77   Ht 5\' 6"  (1.676 m)   Wt 115 lb (52.2 kg)   LMP 06/27/2022   SpO2 98%   BMI 18.56 kg/m  Constitutional: Pleasant,well-developed, Caucasian female in no acute distress. HEENT: Normocephalic and atraumatic. Conjunctivae are normal. No scleral icterus. Neck supple.  Cardiovascular: Normal rate, regular rhythm.  Pulmonary/chest: Effort normal and breath sounds normal. No wheezing, rales or rhonchi. Abdominal: Soft, nondistended, mild diffuse multifocal tenderness to palpation without rigidity  or guarding.  Bowel sounds active throughout. There are no masses palpable. No hepatomegaly. Extremities: no edema Neurological: Alert and oriented to person place and time. Skin: Skin is warm and dry. No rashes noted. Psychiatric: Normal mood and affect. Behavior is normal.  CBC    Component Value Date/Time   WBC 5.3 06/27/2022 1754   RBC 3.84 (L) 06/27/2022 1754   HGB 12.0 06/27/2022 1754   HGB 12.4 01/03/2022 1410   HCT 35.2 (L) 06/27/2022 1754   HCT 37.5 01/03/2022 1410   PLT 207 06/27/2022 1754   PLT 255 01/03/2022 1410   MCV 91.7 06/27/2022 1754   MCV 90 01/03/2022 1410   MCH 31.3 06/27/2022 1754   MCHC 34.1 06/27/2022 1754   RDW 13.5 06/27/2022 1754   RDW 14.1 01/03/2022 1410   LYMPHSABS 2.7 06/27/2022 1754   LYMPHSABS 1.9 01/03/2022 1410   MONOABS 0.3 06/27/2022 1754   EOSABS 0.1 06/27/2022 1754   EOSABS 0.2 01/03/2022 1410  BASOSABS 0.1 06/27/2022 1754   BASOSABS 0.1 01/03/2022 1410    CMP     Component Value Date/Time   NA 139 06/27/2022 1754   NA 139 11/19/2020 1100   K 3.6 06/27/2022 1754   CL 104 06/27/2022 1754   CO2 26 06/27/2022 1754   GLUCOSE 97 06/27/2022 1754   BUN 5 (L) 06/27/2022 1754   BUN 12 11/19/2020 1100   CREATININE 0.61 06/27/2022 1754   CREATININE 0.80 01/19/2019 1602   CALCIUM 9.4 06/27/2022 1754   PROT 6.8 06/27/2022 1754   PROT 6.7 11/19/2020 1100   ALBUMIN 3.9 06/27/2022 1754   ALBUMIN 4.5 11/19/2020 1100   AST 23 06/27/2022 1754   ALT 25 06/27/2022 1754   ALKPHOS 54 06/27/2022 1754   BILITOT 0.6 06/27/2022 1754   BILITOT 0.2 11/19/2020 1100   GFRNONAA >60 06/27/2022 1754   GFRNONAA 101 01/19/2019 1602   GFRAA 117 01/19/2019 1602     ASSESSMENT AND PLAN: 32 year old female with history of anxiety, with several months of abdominal pain, change in bowel habits, with at least 1 episode of hematochezia and 15 pounds of unintentional weight loss.  Workup thus far includes abdominal plain film and basic labs which have all been  unremarkable.  I suspect IBS as the underlying etiology of her symptoms, but given the blood in the stool in the unintentional weight loss, I think a colonoscopy is warranted to definitively rule out mass lesion and inflammatory bowel disease.  Will check TTG/IgA and CRP. In the meantime, I would recommend continuing the [place] for at least 6 to 8 weeks in addition to the [place].  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  Abdominal pain, constipation, hematochezia, weight loss - Colonoscopy to exclude mass lesion/IBD - TTG/IgA, CRP - Continue Linzess daily - Continue MiraLAX 4 capfuls daily - Pelvic floor physical therapy if no improvement in constipation after 6 to 8 weeks  The details, risks (including bleeding, perforation, infection, missed lesions, medication reactions and possible hospitalization or surgery if complications occur), benefits, and alternatives to colonoscopy with possible biopsy and possible polypectomy were discussed with the patient and she consents to proceed.   Jeanette Moffatt E. Candis Schatz, MD [facility]  CC:  Wells Guiles, DO

## 2022-07-08 LAB — IGA: Immunoglobulin A: 194 mg/dL (ref 47–310)

## 2022-07-08 LAB — C-REACTIVE PROTEIN: CRP: <1 mg/dL (ref 0.5–20.0)

## 2022-07-08 LAB — TISSUE TRANSGLUTAMINASE, IGA: (tTG) Ab, IgA: <1 U/mL

## 2022-07-09 NOTE — Progress Notes (Signed)
Bassy, Your test for celiac disease was negative.  Your CRP (inflammatory marker) was also normal, making Crohn's disease unlikely.  Plan to proceed with colonoscopy as planned given your unintentional weight loss and history of blood in the stool.

## 2022-07-11 ENCOUNTER — Telehealth: Payer: Self-pay | Admitting: Gastroenterology

## 2022-07-11 NOTE — Telephone Encounter (Signed)
See recommendations per last office note  ASSESSMENT AND PLAN: 32 year old female with history of anxiety, with several months of abdominal pain, change in bowel habits, with at least 1 episode of hematochezia and 15 pounds of unintentional weight loss.  Workup thus far includes abdominal plain film and basic labs which have all been unremarkable.  I suspect IBS as the underlying etiology of her symptoms, but given the blood in the stool in the unintentional weight loss, I think a colonoscopy is warranted to definitively rule out mass lesion and inflammatory bowel disease.  Will check TTG/IgA and CRP. In the meantime, I would recommend continuing the [place] for at least 6 to 8 weeks in addition to the [place].  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   The pt states she is still having issues with bowel movements. She says she is passing some liquid but feels like she is not emptying.  She is taking 3-4 caps miralax daily as well as 72 mcg Linzess daily.  Per the last office note pelvic floor PT was recommended.  Please advise next steps.

## 2022-07-11 NOTE — Telephone Encounter (Signed)
Patient is on miralax everyday and still experiencing same symptoms and wants to know if she should take more or what other options does she have

## 2022-07-14 NOTE — Telephone Encounter (Signed)
The pt states that she will try 2 linzess at this time and call back with an update.

## 2022-07-22 ENCOUNTER — Other Ambulatory Visit: Payer: Self-pay | Admitting: Family Medicine

## 2022-07-23 ENCOUNTER — Other Ambulatory Visit: Payer: Self-pay

## 2022-07-23 MED ORDER — LINACLOTIDE 72 MCG PO CAPS: 72.0000 ug | ORAL_CAPSULE | Freq: Every day | ORAL | 3 refills | Status: AC

## 2022-07-23 NOTE — Telephone Encounter (Signed)
Prescription sent in as requested 

## 2022-07-23 NOTE — Telephone Encounter (Signed)
This is a Dr Candis Schatz pt- will sent to his nurse

## 2022-07-23 NOTE — Telephone Encounter (Signed)
Patient called, stated Linzess was working and would like to continue. Patient would like refill for Linzess.

## 2022-08-29 ENCOUNTER — Encounter: Payer: Self-pay | Admitting: Student

## 2022-10-07 ENCOUNTER — Telehealth: Payer: Self-pay

## 2022-10-07 NOTE — Telephone Encounter (Signed)
Patient calls nurse line in regards to Mirtazapine.   She reports this medication has been helpful, however she would like to increase her dosage. She reports she is currently taking 7.5mg  at bedtime and is requesting to increase to (2) tablets.   Patient advised she may need an apt. She reports she was just told to call.   Will forward to PCP.

## 2022-10-07 NOTE — Telephone Encounter (Signed)
Returned call to patient. Scheduled for virtual visit tomorrow AM with PCP.   Veronda Prude, RN

## 2022-10-08 ENCOUNTER — Telehealth (INDEPENDENT_AMBULATORY_CARE_PROVIDER_SITE_OTHER): Payer: Medicaid Other | Admitting: Student

## 2022-10-08 ENCOUNTER — Encounter: Payer: Self-pay | Admitting: Student

## 2022-10-08 DIAGNOSIS — F419 Anxiety disorder, unspecified: Secondary | ICD-10-CM | POA: Diagnosis not present

## 2022-10-08 NOTE — Progress Notes (Signed)
[facility] Telemedicine Visit  Patient consented to have virtual visit and was identified by name and date of birth. Method of visit: Telephone  Encounter participants: Patient: Brianna Maddox Provider: Shelby Mattocks - located at [facility]  Chief Complaint: medication management  HPI:  Pt was no Lexapro 20mg  for about 7 years. She had lapse in medication and stopping cold [place]. Mirtazapine 7.5mg  has been helping. She is less hopeless now and is not crying without reason. Mood swings have lessened and feels less irritable. She feels that a higher dose would be beneficial. She is not currently been in counseling, but she has been in involved in counseling in the past.   Denies HI. Denies SI with plan and states she really wants to live which is the reason she wants to get this improved. She notes that a couple family members have questioned if she has bipolar disorder. She is adopted so she does not know about blood relatives being diagnosed with bipolar disorder.  MDQ with 8 positive answers in question one with positive question 2 and meeting moderate impact on life.   ROS: per HPI  Pertinent PMHx: anxiety  Exam:  There were no vitals taken for this visit.  Respiratory: normal WOB  Assessment/Plan: Anxious mood Mirtazapine has been beneficial for her however I have screened her for bipolar disorder with MDQ and she does meet criteria that would prompt further evaluation by psychiatry.  Although originally interested in increasing medication dosage, she is amenable to staying course on mirtazapine 7.5 mg nightly and Ativan 0.5 mg twice daily as needed until she gets further evaluation by psychiatry.  I have also provided her with counseling resources that take Medicaid.  Time spent during visit with patient: 30 minutes

## 2022-10-08 NOTE — Assessment & Plan Note (Signed)
Mirtazapine has been beneficial for her however I have screened her for bipolar disorder with MDQ and she does meet criteria that would prompt further evaluation by psychiatry.  Although originally interested in increasing medication dosage, she is amenable to staying course on mirtazapine 7.5 mg nightly and Ativan 0.5 mg twice daily as needed until she gets further evaluation by psychiatry.  I have also provided her with counseling resources that take Medicaid.

## 2022-10-18 ENCOUNTER — Other Ambulatory Visit: Payer: Self-pay | Admitting: Student

## 2022-11-19 ENCOUNTER — Ambulatory Visit (INDEPENDENT_AMBULATORY_CARE_PROVIDER_SITE_OTHER): Payer: Medicaid Other | Admitting: Clinical

## 2022-11-19 ENCOUNTER — Encounter (HOSPITAL_COMMUNITY): Payer: Self-pay | Admitting: Clinical

## 2022-11-19 DIAGNOSIS — F4310 Post-traumatic stress disorder, unspecified: Secondary | ICD-10-CM
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
## 2022-11-20 ENCOUNTER — Encounter (HOSPITAL_COMMUNITY): Payer: Self-pay

## 2022-11-21 ENCOUNTER — Encounter (HOSPITAL_COMMUNITY): Payer: Self-pay | Admitting: Licensed Clinical Social Worker

## 2022-11-25 ENCOUNTER — Encounter (HOSPITAL_COMMUNITY): Payer: Self-pay

## 2022-11-25 ENCOUNTER — Encounter (HOSPITAL_COMMUNITY): Payer: Medicaid Other | Admitting: Psychiatry

## 2022-11-25 NOTE — Progress Notes (Signed)
This encounter was created in error - please disregard.

## 2022-12-09 ENCOUNTER — Other Ambulatory Visit: Payer: Self-pay | Admitting: Family Medicine

## 2023-01-14 ENCOUNTER — Other Ambulatory Visit: Payer: Self-pay | Admitting: Student

## 2023-03-05 ENCOUNTER — Other Ambulatory Visit: Payer: Self-pay | Admitting: Family Medicine

## 2023-03-05 ENCOUNTER — Ambulatory Visit (INDEPENDENT_AMBULATORY_CARE_PROVIDER_SITE_OTHER): Payer: Medicaid Other | Admitting: Family Medicine

## 2023-03-05 ENCOUNTER — Ambulatory Visit
Admission: RE | Admit: 2023-03-05 | Discharge: 2023-03-05 | Disposition: A | Discharge status: Home / Self Care | Payer: Medicaid Other | Source: Ambulatory Visit | Attending: Family Medicine | Admitting: Family Medicine

## 2023-03-05 ENCOUNTER — Encounter: Payer: Self-pay | Admitting: Family Medicine

## 2023-03-05 VITALS — BP 113/74 | HR 71 | Ht 66.0 in | Wt 113.4 lb

## 2023-03-05 DIAGNOSIS — N644 Mastodynia: Secondary | ICD-10-CM

## 2023-03-05 DIAGNOSIS — R350 Frequency of micturition: Secondary | ICD-10-CM | POA: Diagnosis not present

## 2023-03-05 LAB — POCT URINE PREGNANCY: Preg Test, Ur: NEGATIVE

## 2023-03-05 LAB — POCT UA - MICROSCOPIC ONLY
RBC, Urine, Miroscopic: NONE SEEN (ref 0–2)
WBC, Ur, HPF, POC: NONE SEEN (ref 0–5)

## 2023-03-05 NOTE — Patient Instructions (Signed)
It was wonderful to see you today.  Please bring ALL of your medications with you to every visit.   Today we talked about:  Breast enlargement - I have ordered some labs. I will follow up the results with you and follow up results of the ultrasound as soon as I get it. I recommend making a follow up appointment in 1-2 weeks to make sure that we keep a close eye on this.   Thank you for choosing [facility].   Please call [phone] with any questions about today's appointment.  Please be sure to schedule follow up at the front desk before you leave today.   Lockie Mola, MD  Family Medicine

## 2023-03-05 NOTE — Progress Notes (Signed)
SUBJECTIVE:   CHIEF COMPLAINT / HPI:   L breast feels larger  Patient reports that starting a couple months ago she felt like her left breast was getting larger.  She said she noticed this though she had lost weight.  She says that bout a year ago she used to weigh 120 pounds but has fluctuated now between 107 and 113 pounds.  This weight loss has been unintentional.  She says that she tries to eat a lot but still is not able to gain weight.  She was worked up by GI for possible malabsorption or malignancy and was recommended colonoscopy but patient had not gone at that time.  Says that a couple months ago she also started noticing lumps in her breast.  The left breast had 1 large lump underneath the areola with small lumps tracking up to the axilla.  She noticed some small "pimple-like bumps" on her areola that were draining white fluid in the nipple itself drained a thick white substance.  She had mentioned it to her gynecologist at the time who told her to stop expressing the breast.  Denies any bloody discharge from the breast.  Patient continued to have pain and continued to have enlargement of her left breast.  Patient denies any change of symptoms at different times of her menstrual cycle.  Denies night chills, fevers.  She does not know much of her family history as she is adopted.  Though, she does say that her biological mom had 7 siblings and many of them passed away from breast cancer.  She does not know her family medical history on her father's side.  She has not been on any antipsychotics.  Patient does endorse having sexual intercourse about 1 week ago.  She says that she does not use any contraception.  Frequency  Patient mentions that she started having abdominal cramping as well as frequency and urgency about 2 months ago.  She had been seen at an urgent care 3 weeks into the symptom onset and was treated for a UTI.  However now has been a couple weeks since treatment with  antibiotics and patient continues to have occasional abdominal cramping with frequency and urgency.  Denies any dysuria.  Says that her urine is still cloudy.  Denies any fevers  PERTINENT  PMH / PSH: Anxiety, MDD, Menorrhagia, Unintentional weight loss   OBJECTIVE:   BP 113/74   Pulse 71   Ht 5\' 6"  (1.676 m)   Wt 113 lb 6.4 oz (51.4 kg)   LMP 02/13/2023 (Exact Date)   SpO2 100%   BMI 18.30 kg/m   General: well appearing, in no acute distress, anxious CV: RRR, radial pulses equal and palpable, no BLE edema  Resp: Normal work of breathing on room air, CTAB Abd: Soft, mildly tender to palpation in suprapubic region, non distended  Neuro: Alert & Oriented  Breast: Right breast anatomically normal, left breast 2-3 times the size of right, with skin colored papules within the areolar space without any expressible drainage, immobile subareolar mass palpable to 2 to 3 cm above the areola as well, small palpable masses tracking up to axilla, all masses painful to palpation no lymphadenopathy palpable in axilla, no change in color to the breast   ASSESSMENT/PLAN:   Assessment & Plan Mastalgia in female L breast tenderness along with increasing size over the last few months is concerning.  Especially in the setting of strong family history of breast cancer.  The tracking of  lumps from the central mass to the axilla could be spreading of the mass or clogging of the ducts from the central mass. - Left breast ultrasound today, pending diagnostic mammography after this ultrasound - CBC and CMP - POCT urine preg - Recommended follow-up in 1 to 2 weeks Frequency of urination Could be failure of treatment with previous antibiotics.  Patient has 2 of 3 symptoms and increases likelihood of continued current urinary tract infection.  Given no CVA tenderness and vital signs are stable unlikely Pyelo. - Urinalysis     Lockie Mola, MD [facility]

## 2023-03-06 ENCOUNTER — Other Ambulatory Visit: Payer: Self-pay | Admitting: Family Medicine

## 2023-03-06 LAB — CBC WITH DIFFERENTIAL/PLATELET
Basophils Absolute: 0.1 10*3/uL (ref 0.0–0.2)
Basos: 1 %
EOS (ABSOLUTE): 0.1 10*3/uL (ref 0.0–0.4)
Eos: 2 %
Hematocrit: 36.8 % (ref 34.0–46.6)
Hemoglobin: 12.3 g/dL (ref 11.1–15.9)
Immature Grans (Abs): 0 10*3/uL (ref 0.0–0.1)
Immature Granulocytes: 0 %
Lymphocytes Absolute: 2.1 10*3/uL (ref 0.7–3.1)
Lymphs: 40 %
MCH: 31.1 pg (ref 26.6–33.0)
MCHC: 33.4 g/dL (ref 31.5–35.7)
MCV: 93 fL (ref 79–97)
Monocytes Absolute: 0.5 10*3/uL (ref 0.1–0.9)
Monocytes: 10 %
Neutrophils Absolute: 2.6 10*3/uL (ref 1.4–7.0)
Neutrophils: 47 %
Platelets: 194 10*3/uL (ref 150–450)
RBC: 3.95 x10E6/uL (ref 3.77–5.28)
RDW: 13.6 % (ref 11.7–15.4)
WBC: 5.3 10*3/uL (ref 3.4–10.8)

## 2023-03-06 LAB — COMPREHENSIVE METABOLIC PANEL
ALT: 16 IU/L (ref 0–32)
AST: 16 IU/L (ref 0–40)
Albumin: 4.4 g/dL (ref 3.9–4.9)
Alkaline Phosphatase: 58 IU/L (ref 44–121)
BUN/Creatinine Ratio: 15 (ref 9–23)
BUN: 10 mg/dL (ref 6–20)
Bilirubin Total: 0.5 mg/dL (ref 0.0–1.2)
CO2: 23 mmol/L (ref 20–29)
Calcium: 9.4 mg/dL (ref 8.7–10.2)
Chloride: 103 mmol/L (ref 96–106)
Creatinine, Ser: 0.67 mg/dL (ref 0.57–1.00)
Globulin, Total: 2.4 g/dL (ref 1.5–4.5)
Glucose: 91 mg/dL (ref 70–99)
Potassium: 5 mmol/L (ref 3.5–5.2)
Sodium: 139 mmol/L (ref 134–144)
Total Protein: 6.8 g/dL (ref 6.0–8.5)
eGFR: 120 mL/min/{1.73_m2} (ref >59)

## 2023-03-13 ENCOUNTER — Ambulatory Visit: Payer: Self-pay | Admitting: Student

## 2023-03-13 NOTE — Progress Notes (Deleted)
  SUBJECTIVE:   CHIEF COMPLAINT / HPI:   Follow-up regarding mastalgia of the left breast.  Urine pregnancy, CBC, CMP, ultrasound and diagnostic mammogram were unremarkable.  PERTINENT  PMH / PSH: Asthma, anxiety, constipation  OBJECTIVE:  LMP 02/13/2023 (Exact Date)  Physical Exam   ASSESSMENT/PLAN:  There are no diagnoses linked to this encounter. No follow-ups on file. Shelby Mattocks, DO 03/13/2023, 7:51 AM PGY-***, [facility] {    This will disappear when note is signed, click to select method of visit    :1}

## 2023-06-04 ENCOUNTER — Telehealth: Payer: Self-pay

## 2023-06-04 NOTE — Telephone Encounter (Signed)
Patient calls nurse line requesting an apt for abdominal pain.   She reports she was seen a few weeks ago at a [facility] and was treated for a UTI and yeast infection.   She reports she is continuing to have abdominal discomfort. She reports the pain was "so severe" yesterday she almost went to the hospital. She reports anytime she tried to eat the pain would be worse. She reports her LMP was ~ 30 days ago. She reports she took (3) negative pregnancy tests this week.  She denies any nausea, vomiting or diarrhea. She reports her last BM was yesterday and normal. She denies any symptoms of a UTI. She denies any vaginal discharge or discomfort.  She reports she feels she has a gynecological issue vs gastro. She reports she does not have a current gynecologist.   Patient already has an apt scheduled for tomorrow.   ED precautions discussed in the meantime.

## 2023-06-05 ENCOUNTER — Emergency Department (HOSPITAL_BASED_OUTPATIENT_CLINIC_OR_DEPARTMENT_OTHER)
Admission: EM | Admit: 2023-06-05 | Discharge: 2023-06-05 | Disposition: A | Discharge status: Home / Self Care | Payer: Medicaid Other | Attending: Emergency Medicine | Admitting: Emergency Medicine

## 2023-06-05 ENCOUNTER — Other Ambulatory Visit: Payer: Self-pay

## 2023-06-05 ENCOUNTER — Encounter (HOSPITAL_BASED_OUTPATIENT_CLINIC_OR_DEPARTMENT_OTHER): Payer: Self-pay

## 2023-06-05 ENCOUNTER — Ambulatory Visit: Payer: Medicaid Other

## 2023-06-05 DIAGNOSIS — N760 Acute vaginitis: Secondary | ICD-10-CM | POA: Insufficient documentation

## 2023-06-05 DIAGNOSIS — R1084 Generalized abdominal pain: Secondary | ICD-10-CM | POA: Diagnosis present

## 2023-06-05 DIAGNOSIS — J029 Acute pharyngitis, unspecified: Secondary | ICD-10-CM | POA: Diagnosis not present

## 2023-06-05 DIAGNOSIS — N898 Other specified noninflammatory disorders of vagina: Secondary | ICD-10-CM

## 2023-06-05 LAB — WET PREP, GENITAL
Clue Cells Wet Prep HPF POC: NONE SEEN
Sperm: NONE SEEN
Trich, Wet Prep: NONE SEEN
WBC, Wet Prep HPF POC: <10 (ref <10)
Yeast Wet Prep HPF POC: NONE SEEN

## 2023-06-05 LAB — CBC WITH DIFFERENTIAL/PLATELET
Abs Immature Granulocytes: 0 10*3/uL (ref 0.00–0.07)
Basophils Absolute: 0 10*3/uL (ref 0.0–0.1)
Basophils Relative: 1 %
Eosinophils Absolute: 0.1 10*3/uL (ref 0.0–0.5)
Eosinophils Relative: 2 %
HCT: 36.8 % (ref 36.0–46.0)
Hemoglobin: 12.4 g/dL (ref 12.0–15.0)
Immature Granulocytes: 0 %
Lymphocytes Relative: 39 %
Lymphs Abs: 2.1 10*3/uL (ref 0.7–4.0)
MCH: 31.7 pg (ref 26.0–34.0)
MCHC: 33.7 g/dL (ref 30.0–36.0)
MCV: 94.1 fL (ref 80.0–100.0)
Monocytes Absolute: 0.4 10*3/uL (ref 0.1–1.0)
Monocytes Relative: 8 %
Neutro Abs: 2.6 10*3/uL (ref 1.7–7.7)
Neutrophils Relative %: 50 %
Platelets: 197 10*3/uL (ref 150–400)
RBC: 3.91 MIL/uL (ref 3.87–5.11)
RDW: 13.2 % (ref 11.5–15.5)
WBC: 5.2 10*3/uL (ref 4.0–10.5)
nRBC: 0 % (ref 0.0–0.2)

## 2023-06-05 LAB — URINALYSIS, ROUTINE W REFLEX MICROSCOPIC
Bilirubin Urine: NEGATIVE
Glucose, UA: NEGATIVE mg/dL
Hgb urine dipstick: NEGATIVE
Ketones, ur: NEGATIVE mg/dL
Leukocytes,Ua: NEGATIVE
Nitrite: NEGATIVE
Protein, ur: NEGATIVE mg/dL
Specific Gravity, Urine: 1.019 (ref 1.005–1.030)
pH: 7 (ref 5.0–8.0)

## 2023-06-05 LAB — COMPREHENSIVE METABOLIC PANEL
ALT: 13 U/L (ref 0–44)
AST: 13 U/L — ABNORMAL LOW (ref 15–41)
Albumin: 4.6 g/dL (ref 3.5–5.0)
Alkaline Phosphatase: 48 U/L (ref 38–126)
Anion gap: 7 (ref 5–15)
BUN: 11 mg/dL (ref 6–20)
CO2: 29 mmol/L (ref 22–32)
Calcium: 9.8 mg/dL (ref 8.9–10.3)
Chloride: 103 mmol/L (ref 98–111)
Creatinine, Ser: 0.71 mg/dL (ref 0.44–1.00)
GFR, Estimated: >60 mL/min (ref >60)
Glucose, Bld: 92 mg/dL (ref 70–99)
Potassium: 3.7 mmol/L (ref 3.5–5.1)
Sodium: 139 mmol/L (ref 135–145)
Total Bilirubin: 0.6 mg/dL (ref <1.2)
Total Protein: 7.3 g/dL (ref 6.5–8.1)

## 2023-06-05 LAB — LIPASE, BLOOD: Lipase: 16 U/L (ref 11–51)

## 2023-06-05 LAB — HIV ANTIBODY (ROUTINE TESTING W REFLEX): HIV Screen 4th Generation wRfx: NONREACTIVE

## 2023-06-05 LAB — PREGNANCY, URINE: Preg Test, Ur: NEGATIVE

## 2023-06-05 NOTE — ED Triage Notes (Signed)
Pt reports having chronic yeast infection. Having been going on  X 1 month. Treat not working. Scalp itch and bleeding, skin discoloration, gums bleeding. Also endorse abdominal pain

## 2023-06-05 NOTE — ED Notes (Signed)
Pt alert and oriented X 4 at the time of discharge. RR even and unlabored. No acute distress noted. Pt verbalized understanding of discharge instructions as discussed. Pt ambulatory to lobby at time of discharge.

## 2023-06-05 NOTE — Discharge Instructions (Signed)
Your lab work looks good.  As we discussed in the room that does not mean that nothing is wrong.  Please call your family doctor or GI doctor and GYN to try to establish follow-up so that they can try to figure out what is going on with you.

## 2023-06-05 NOTE — ED Provider Notes (Signed)
[place] EMERGENCY DEPARTMENT AT [facility] Provider Note   CSN: [id number] Arrival date & time: 06/05/23  0102     History  Chief Complaint  Patient presents with   Vaginal Itching   Abdominal Pain    Brianna Maddox is a 32 y.o. female.  32 yo F with a chief complaints of not feeling well.  She tells me that for years she has had abdominal pain recurrent vaginal yeast infections and has seen multiple providers to try and figure out what is wrong with her.  She feels like its gotten worse over the past couple weeks and she was not sure that she is going to make it through the weekend unless she was seen.  She had called her family doctor yesterday and it sounds like they had appointment for her today but she decided come here to be evaluated.  She feels like the yeast infection now is in her throat and she has had a sore throat for couple days.  She has had multiple rounds of antibiotics and antifungal medicines without improvement.  She has seen GI.   Vaginal Itching Associated symptoms include abdominal pain.  Abdominal Pain      Home Medications Prior to Admission medications   Medication Sig Start Date End Date Taking? Authorizing Provider  dicyclomine (BENTYL) 20 MG tablet Take 1 tablet (20 mg total) by mouth every 12 (twelve) hours as needed (for abdominal pain/cramping). 06/28/22   Antony Madura, PA-C  levocetirizine (XYZAL) 5 MG tablet TAKE 1 TABLET BY MOUTH DAILY FOR ALLERGIES 10/20/22   Erick Alley, DO  linaclotide [facility]) 72 MCG capsule Take 1 capsule (72 mcg total) by mouth daily before breakfast. 07/23/22   Jenel Lucks, MD  LORazepam (ATIVAN) 0.5 MG tablet Take 1 tablet (0.5 mg total) by mouth 2 (two) times daily as needed for anxiety. 06/12/22   Lilland, Alana, DO  mirtazapine (REMERON) 7.5 MG tablet TAKE 1 TABLET BY MOUTH EVERYDAY AT BEDTIME 01/14/23   Shelby Mattocks, DO  montelukast (SINGULAIR) 10 MG tablet TAKE 1 TABLET BY MOUTH EVERY DAY  12/09/22   Shelby Mattocks, DO  ondansetron (ZOFRAN-ODT) 4 MG disintegrating tablet Take 1 tablet (4 mg total) by mouth every 8 (eight) hours as needed for nausea or vomiting. 06/28/22   Antony Madura, PA-C      Allergies    Patient has no known allergies.    Review of Systems   Review of Systems  Gastrointestinal:  Positive for abdominal pain.    Physical Exam Updated Vital Signs BP 104/71   Pulse 81   Temp 98.7 F (37.1 C) (Oral)   Resp 18   SpO2 100%  Physical Exam Vitals and nursing note reviewed. Exam conducted with a chaperone present.  Constitutional:      General: She is not in acute distress.    Appearance: She is well-developed. She is not diaphoretic.     Comments: Smells of marijuana  HENT:     Head: Normocephalic and atraumatic.     Mouth/Throat:     Comments: No obvious edema to the posterior oropharynx.  Patient has a very small amount of whitish coating to the tongue at the posterior oropharynx. Eyes:     Pupils: Pupils are equal, round, and reactive to light.  Cardiovascular:     Rate and Rhythm: Normal rate and regular rhythm.     Heart sounds: No murmur heard.    No friction rub. No gallop.  Pulmonary:  Effort: Pulmonary effort is normal.     Breath sounds: No wheezing or rales.  Abdominal:     General: There is no distension.     Palpations: Abdomen is soft.     Tenderness: There is no abdominal tenderness.  Genitourinary:    Comments: No obvious irritation to the vagina externally.  She does have a small amount of whitish discharge at the introitus.  There is no erythema no satellite lesions. Musculoskeletal:        General: No tenderness.     Cervical back: Normal range of motion and neck supple.  Skin:    General: Skin is warm and dry.  Neurological:     Mental Status: She is alert and oriented to person, place, and time.  Psychiatric:        Behavior: Behavior normal.     ED Results / Procedures / Treatments   Labs (all labs ordered  are listed, but only abnormal results are displayed) Labs Reviewed  COMPREHENSIVE METABOLIC PANEL - Abnormal; Notable for the following components:      Result Value   AST 13 (*)    All other components within normal limits  WET PREP, GENITAL  CBC WITH DIFFERENTIAL/PLATELET  LIPASE, BLOOD  URINALYSIS, ROUTINE W REFLEX MICROSCOPIC  PREGNANCY, URINE  RPR  HIV ANTIBODY (ROUTINE TESTING W REFLEX)    EKG None  Radiology No results found.  Procedures Procedures    Medications Ordered in ED Medications - No data to display  ED Course/ Medical Decision Making/ A&P                                 Medical Decision Making Amount and/or Complexity of Data Reviewed Labs: ordered.   32 yo F with a chief complaints of sore throat abdominal pain vaginal irritation.  The abdominal pain and vaginal irritation been going on for years.  She has seen both GI and GYN for this.  Has been on multiple antibiotics as well as antifungal medications.  She is worried that maybe she has fungemia.  She tells me she does not feel well and would like a fungal culture and maybe a cortisol level.  She is well-appearing and nontoxic.  She has no obvious focal abdominal tenderness and her abdomen is soft.  Of her symptoms only her throat symptoms are new.  I do not see any obvious signs of bacterial infection.  I think is less likely that the patient has thrush.   I had a long discussion with the patient at bedside about further evaluation in the office.  I will obtain laboratory evaluation here.  My record review the patient has had a recent thyroid study that was normal.  I do not see HIV testing in some years and so I will order that today.  Patient's lab work is resulted and is reassuring.  She is not anemic, UA is negative for infection, wet prep without yeast or trichomonas.  No significant electrolyte abnormalities.  Encouraged her to follow-up as an outpatient.  11:05 AM:  I have discussed the  diagnosis/risks/treatment options with the patient.  Evaluation and diagnostic testing in the emergency department does not suggest an emergent condition requiring admission or immediate intervention beyond what has been performed at this time.  They will follow up with PCP, GYN, GI. We also discussed returning to the ED immediately if new or worsening sx occur. We discussed the sx  which are most concerning (e.g., sudden worsening pain, fever, inability to tolerate by mouth) that necessitate immediate return. Medications administered to the patient during their visit and any new prescriptions provided to the patient are listed below.  Medications given during this visit Medications - No data to display   The patient appears reasonably screen and/or stabilized for discharge and I doubt any other medical condition or other [facility] requiring further screening, evaluation, or treatment in the ED at this time prior to discharge.          Final Clinical Impression(s) / ED Diagnoses Final diagnoses:  Generalized abdominal pain  Sore throat  Vaginal irritation    Rx / DC Orders ED Discharge Orders     None         Melene Plan, DO 06/05/23 1105

## 2023-06-06 LAB — RPR: RPR Ser Ql: NONREACTIVE

## 2023-06-08 ENCOUNTER — Telehealth: Payer: Self-pay

## 2023-06-08 ENCOUNTER — Other Ambulatory Visit: Payer: Self-pay | Admitting: Family Medicine

## 2023-06-08 ENCOUNTER — Ambulatory Visit (INDEPENDENT_AMBULATORY_CARE_PROVIDER_SITE_OTHER): Payer: Medicaid Other | Admitting: Family Medicine

## 2023-06-08 ENCOUNTER — Encounter: Payer: Self-pay | Admitting: Family Medicine

## 2023-06-08 ENCOUNTER — Other Ambulatory Visit (HOSPITAL_COMMUNITY)
Admission: RE | Admit: 2023-06-08 | Discharge: 2023-06-08 | Disposition: A | Discharge status: Home / Self Care | Payer: Medicaid Other | Source: Ambulatory Visit | Attending: Family Medicine | Admitting: Family Medicine

## 2023-06-08 VITALS — BP 97/60 | HR 62 | Ht 66.0 in | Wt 109.2 lb

## 2023-06-08 DIAGNOSIS — K5900 Constipation, unspecified: Secondary | ICD-10-CM

## 2023-06-08 DIAGNOSIS — R1084 Generalized abdominal pain: Secondary | ICD-10-CM | POA: Diagnosis not present

## 2023-06-08 DIAGNOSIS — R5383 Other fatigue: Secondary | ICD-10-CM | POA: Diagnosis not present

## 2023-06-08 DIAGNOSIS — N898 Other specified noninflammatory disorders of vagina: Secondary | ICD-10-CM | POA: Diagnosis present

## 2023-06-08 DIAGNOSIS — R634 Abnormal weight loss: Secondary | ICD-10-CM

## 2023-06-08 DIAGNOSIS — R102 Pelvic and perineal pain: Secondary | ICD-10-CM | POA: Diagnosis present

## 2023-06-08 LAB — POCT SEDIMENTATION RATE: POCT SED RATE: 3 mm/h (ref 0–22)

## 2023-06-08 LAB — POCT GLYCOSYLATED HEMOGLOBIN (HGB A1C): Hemoglobin A1C: 5.4 % (ref 4.0–5.6)

## 2023-06-08 NOTE — Patient Instructions (Signed)
It was great to see you!  Our plans for today:  - We are checking some labs today, we will release these results to your MyChart. - I recommend calling your GI doctor to schedule your colonoscopy. - We are getting an ultrasound of your pelvis. We will call you with these results.   Take care and seek immediate care sooner if you develop any concerns.   Dr. Linwood Dibbles

## 2023-06-08 NOTE — Progress Notes (Unsigned)
   SUBJECTIVE:   CHIEF COMPLAINT / HPI:   Has constellation of symptoms she would like to discuss. Chronic abdominal pain, frequent vaginal yeast infections, unintentional weight loss, irregular periods, fatigue. Thinks she may have underlying autoimmune issue and would like genetic testing.   Abdominal pain, irregular periods - generalized. Previously seen in ED 12/6 for same.  Wet prep negative for yeast. CBC/CMP, UA, Upreg, HIV, RPR all normal.  - h/o chronic constipation on linzess, bentyl. Didn't feel these helped.  - previously saw GI 06/2022. Suspected IBS for cause but planned for colonoscopy to r/o mass and IBD given h/o blood in stool and weight loss. Also recommended pelvic floor PT if no improvement in constipation after 6-8 wks. Has not proceeded with colonoscopy yet. - no recent blood in stool.  - LMP 03/2023. - sexually active. - Last saw GYN at [facility] sometime in the summer. Unsure if she had an ultrasound to work up her irregular periods and abdominal/pelvic pain.  Desire for genetic testing - adopted.  - knows mom's siblings had various forms of cancer - Half brother with thyroid disease. - Doesn't know birth father.  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    OBJECTIVE:   BP 97/60   Pulse 62   Ht 5\' 6"  (1.676 m)   Wt 109 lb 3.2 oz (49.5 kg)   SpO2 100%   BMI 17.63 kg/m   Gen: well appearing, in NAD Card: RRR Lungs: CTAB Abd: soft, TTP diffusely, +BS. GYN:  External genitalia within normal limits.  Vaginal mucosa pink, moist, normal rugae.  Nonfriable cervix without lesions, no discharge, scant bleeding noted on speculum exam.  No cervical motion tenderness.   Ext: WWP, no edema   ASSESSMENT/PLAN:   Abdominal pain Chronic with h/o chronic constipation and unintentional weight loss. Benign exam today and previous labs 3 days ago wnl. Agree with proceeding with colonoscopy as next step given  weight loss, refractory pain/constipation, and h/o blood in stool. Will also obtain pelvic ultrasound, TSH given menstrual changes.  F/u 4 weeks, recommend PHQ/GAD at that time.   Desire for genetic testing Reviewed prior inflammatory testing with normal ESR, CRP, HLA-B27, Iga, TTG all normal. In the absence of specific symptoms consistent with a particular autoimmune condition, doubt the utility of further autoimmune testing. No current symptoms or exam findings to concern for possible APECD but encouraged reaching out to allergist to determine utility of further workup.  F/u 4 weeks, recommend PHQ/GAD at that time.  Darl Householder Naylah Cork, DO    Total time spent on today's visit was greater than 30 minutes, including both face-to-face time and nonface-to-face time personally spent on review of chart (labs and imaging), discussing labs and goals, discussing further work-up, treatment options, referrals to specialist if needed, reviewing outside records of pertinent, answering patient's questions, and coordinating care.

## 2023-06-08 NOTE — Telephone Encounter (Signed)
Called Scheduling department and spoke with scheduler Colette to make an appointment for pelvic US for paitent. Called patient to inform her about her upcoming [place] appointment Scheduled for 12/11 at 2pm at [facility] Patient needs to arrive at 1:45pm with a full bladder. At the Radiology entrance.  Drusilla Kanner, CMA

## 2023-06-09 ENCOUNTER — Encounter: Payer: Self-pay | Admitting: Family Medicine

## 2023-06-09 DIAGNOSIS — R109 Unspecified abdominal pain: Secondary | ICD-10-CM | POA: Insufficient documentation

## 2023-06-09 LAB — CERVICOVAGINAL ANCILLARY ONLY
Bacterial Vaginitis (gardnerella): NEGATIVE
Candida Glabrata: NEGATIVE
Candida Vaginitis: NEGATIVE
Chlamydia: NEGATIVE
Comment: NEGATIVE
Comment: NEGATIVE
Comment: NEGATIVE
Comment: NEGATIVE
Comment: NEGATIVE
Comment: NORMAL
Neisseria Gonorrhea: NEGATIVE
Trichomonas: NEGATIVE

## 2023-06-09 LAB — TSH RFX ON ABNORMAL TO FREE T4: TSH: 1.74 u[IU]/mL (ref 0.450–4.500)

## 2023-06-09 LAB — VITAMIN B12: Vitamin B-12: 617 pg/mL (ref 232–1245)

## 2023-06-09 NOTE — Assessment & Plan Note (Addendum)
Chronic with h/o chronic constipation and unintentional weight loss. Benign exam today and previous labs 3 days ago wnl. Agree with proceeding with colonoscopy as next step given weight loss, refractory pain/constipation, and h/o blood in stool. Will also obtain pelvic ultrasound, TSH given menstrual changes.  F/u 4 weeks, recommend PHQ/GAD at that time.

## 2023-06-10 ENCOUNTER — Ambulatory Visit (HOSPITAL_COMMUNITY): Admission: RE | Admit: 2023-06-10 | Payer: Medicaid Other | Source: Ambulatory Visit

## 2023-07-20 ENCOUNTER — Other Ambulatory Visit: Payer: Self-pay | Admitting: Student

## 2023-08-11 ENCOUNTER — Other Ambulatory Visit: Payer: Self-pay | Admitting: Student

## 2023-09-09 ENCOUNTER — Other Ambulatory Visit: Payer: Self-pay

## 2023-09-09 ENCOUNTER — Emergency Department (HOSPITAL_BASED_OUTPATIENT_CLINIC_OR_DEPARTMENT_OTHER)

## 2023-09-09 ENCOUNTER — Emergency Department (HOSPITAL_BASED_OUTPATIENT_CLINIC_OR_DEPARTMENT_OTHER)
Admission: EM | Admit: 2023-09-09 | Discharge: 2023-09-10 | Disposition: A | Discharge status: Home / Self Care | Attending: Emergency Medicine | Admitting: Emergency Medicine

## 2023-09-09 ENCOUNTER — Encounter (HOSPITAL_BASED_OUTPATIENT_CLINIC_OR_DEPARTMENT_OTHER): Payer: Self-pay

## 2023-09-09 DIAGNOSIS — J45909 Unspecified asthma, uncomplicated: Secondary | ICD-10-CM | POA: Diagnosis not present

## 2023-09-09 DIAGNOSIS — R519 Headache, unspecified: Secondary | ICD-10-CM | POA: Diagnosis present

## 2023-09-09 DIAGNOSIS — R0981 Nasal congestion: Secondary | ICD-10-CM | POA: Insufficient documentation

## 2023-09-09 LAB — CBC WITH DIFFERENTIAL/PLATELET
Abs Immature Granulocytes: 0.02 10*3/uL (ref 0.00–0.07)
Basophils Absolute: 0 10*3/uL (ref 0.0–0.1)
Basophils Relative: 1 %
Eosinophils Absolute: 0.2 10*3/uL (ref 0.0–0.5)
Eosinophils Relative: 3 %
HCT: 34.7 % — ABNORMAL LOW (ref 36.0–46.0)
Hemoglobin: 11.7 g/dL — ABNORMAL LOW (ref 12.0–15.0)
Immature Granulocytes: 0 %
Lymphocytes Relative: 28 %
Lymphs Abs: 2.3 10*3/uL (ref 0.7–4.0)
MCH: 32.1 pg (ref 26.0–34.0)
MCHC: 33.7 g/dL (ref 30.0–36.0)
MCV: 95.3 fL (ref 80.0–100.0)
Monocytes Absolute: 0.5 10*3/uL (ref 0.1–1.0)
Monocytes Relative: 5 %
Neutro Abs: 5.2 10*3/uL (ref 1.7–7.7)
Neutrophils Relative %: 63 %
Platelets: 204 10*3/uL (ref 150–400)
RBC: 3.64 MIL/uL — ABNORMAL LOW (ref 3.87–5.11)
RDW: 13.3 % (ref 11.5–15.5)
WBC: 8.3 10*3/uL (ref 4.0–10.5)
nRBC: 0 % (ref 0.0–0.2)

## 2023-09-09 LAB — RESP PANEL BY RT-PCR (RSV, FLU A&B, COVID)  RVPGX2
Influenza A by PCR: NEGATIVE
Influenza B by PCR: NEGATIVE
Resp Syncytial Virus by PCR: NEGATIVE
SARS Coronavirus 2 by RT PCR: NEGATIVE

## 2023-09-09 LAB — BASIC METABOLIC PANEL
Anion gap: 7 (ref 5–15)
BUN: 9 mg/dL (ref 6–20)
CO2: 27 mmol/L (ref 22–32)
Calcium: 9.1 mg/dL (ref 8.9–10.3)
Chloride: 105 mmol/L (ref 98–111)
Creatinine, Ser: 0.61 mg/dL (ref 0.44–1.00)
GFR, Estimated: >60 mL/min (ref >60)
Glucose, Bld: 87 mg/dL (ref 70–99)
Potassium: 3.7 mmol/L (ref 3.5–5.1)
Sodium: 139 mmol/L (ref 135–145)

## 2023-09-09 LAB — HCG, SERUM, QUALITATIVE: Preg, Serum: NEGATIVE

## 2023-09-09 MED ORDER — ONDANSETRON HCL 4 MG PO TABS: 4.0000 mg | ORAL_TABLET | Freq: Four times a day (QID) | ORAL | 0 refills | Status: AC

## 2023-09-09 MED ORDER — KETOROLAC TROMETHAMINE 15 MG/ML IJ SOLN
15.0000 mg | Freq: Once | INTRAMUSCULAR | Status: AC
  Administered 2023-09-09: 15 mg via INTRAVENOUS
  Filled 2023-09-09: qty 1

## 2023-09-09 MED ORDER — ACETAMINOPHEN 500 MG PO TABS
1000.0000 mg | ORAL_TABLET | Freq: Once | ORAL | Status: AC
  Administered 2023-09-09: 1000 mg via ORAL
  Filled 2023-09-09: qty 2

## 2023-09-09 MED ORDER — METOCLOPRAMIDE HCL 5 MG/ML IJ SOLN
10.0000 mg | Freq: Once | INTRAMUSCULAR | Status: AC
  Administered 2023-09-09: 10 mg via INTRAVENOUS
  Filled 2023-09-09: qty 2

## 2023-09-09 MED ORDER — SODIUM CHLORIDE 0.9 % IV BOLUS
1000.0000 mL | Freq: Once | INTRAVENOUS | Status: AC
  Administered 2023-09-09: 1000 mL via INTRAVENOUS

## 2023-09-09 NOTE — ED Triage Notes (Signed)
 Pt arrives with c/o headache for a week states pain is getting worse everyday, denies any fevers. Pt recently had strep and was antibiotics for same.

## 2023-09-09 NOTE — Discharge Instructions (Signed)
 Evaluation was overall reassuring.  Please follow with your PCP.  If you have worsening headache, visual disturbance, develop a fever, or any other concerning symptom please return emerged part further evaluation.  Suspect this was a migraine.

## 2023-09-09 NOTE — ED Provider Notes (Signed)
 [place] [facility] AT [facility] Provider Note   CSN: [id number] Arrival date & time: 09/09/23  1711     History  Chief Complaint  Patient presents with   Headache   HPI Brianna Maddox is a 33 y.o. female presenting for headache.  Started about a week ago but seems to be getting worse. Originated in the RUQ of face and radiates to back of the head. Endorsing some photosensitivity and photophobia.  Denies nuchal rigidity or visual disturbance. Recent d/x of strep and just finished up her antibiotics. Also endorsing nasal congestion.    Past Medical History:  Diagnosis Date   Abscessed tooth 2016   during pregnancy, had antibiotics   Anxiety    Asthma    Benzodiazepine abuse (HCC) 04/19/2018   Headache    Lobar pneumonia (HCC) 10/30/2017   Sepsis (HCC) 10/30/2017      Headache      Home Medications Prior to Admission medications   Medication Sig Start Date End Date Taking? Authorizing Provider  ondansetron (ZOFRAN) 4 MG tablet Take 1 tablet (4 mg total) by mouth every 6 (six) hours. 09/09/23  Yes Gareth Eagle, PA-C  dicyclomine (BENTYL) 20 MG tablet Take 1 tablet (20 mg total) by mouth every 12 (twelve) hours as needed (for abdominal pain/cramping). 06/28/22   Antony Madura, PA-C  levocetirizine (XYZAL) 5 MG tablet TAKE 1 TABLET BY MOUTH DAILY FOR ALLERGIES 08/11/23   Billey Co, MD  linaclotide [facility]) 72 MCG capsule Take 1 capsule (72 mcg total) by mouth daily before breakfast. 07/23/22   Jenel Lucks, MD  LORazepam (ATIVAN) 0.5 MG tablet Take 1 tablet (0.5 mg total) by mouth 2 (two) times daily as needed for anxiety. 06/12/22   Lilland, Alana, DO  mirtazapine (REMERON) 7.5 MG tablet TAKE 1 TABLET BY MOUTH EVERYDAY AT BEDTIME 01/14/23   Shelby Mattocks, DO  montelukast (SINGULAIR) 10 MG tablet TAKE 1 TABLET BY MOUTH EVERY DAY 07/21/23   Alicia Amel, MD  ondansetron (ZOFRAN-ODT) 4 MG disintegrating tablet Take 1 tablet (4 mg total) by  mouth every 8 (eight) hours as needed for nausea or vomiting. 06/28/22   Antony Madura, PA-C      Allergies    Patient has no known allergies.    Review of Systems   Review of Systems  Neurological:  Positive for headaches.    Physical Exam Updated Vital Signs BP (!) 103/51   Pulse (!) 55   Temp 98.9 F (37.2 C)   Resp 18   Ht 5\' 6"  (1.676 m)   Wt 49.9 kg   LMP 09/08/2023   SpO2 100%   BMI 17.75 kg/m  Physical Exam Vitals and nursing note reviewed.  HENT:     Head: Normocephalic and atraumatic.     Mouth/Throat:     Mouth: Mucous membranes are moist.  Eyes:     General:        Right eye: No discharge.        Left eye: No discharge.     Conjunctiva/sclera: Conjunctivae normal.  Cardiovascular:     Rate and Rhythm: Normal rate and regular rhythm.     Pulses: Normal pulses.     Heart sounds: Normal heart sounds.  Pulmonary:     Effort: Pulmonary effort is normal.     Breath sounds: Normal breath sounds.  Abdominal:     General: Abdomen is flat.     Palpations: Abdomen is soft.  Skin:    General:  Skin is warm and dry.  Neurological:     General: No focal deficit present.     Comments: GCS 15. Speech is goal oriented. No deficits appreciated to CN III-XII; symmetric eyebrow raise, no facial drooping, tongue midline. Patient has equal grip strength bilaterally with 5/5 strength against resistance in all major muscle groups bilaterally. Sensation to light touch intact. Patient moves extremities without ataxia. Normal finger-nose-finger. Patient ambulatory with steady gait.  Psychiatric:        Mood and Affect: Mood normal.     ED Results / Procedures / Treatments   Labs (all labs ordered are listed, but only abnormal results are displayed) Labs Reviewed  CBC WITH DIFFERENTIAL/PLATELET - Abnormal; Notable for the following components:      Result Value   RBC 3.64 (*)    Hemoglobin 11.7 (*)    HCT 34.7 (*)    All other components within normal limits  RESP PANEL  BY RT-PCR (RSV, FLU A&B, COVID)  RVPGX2  BASIC METABOLIC PANEL  HCG, SERUM, QUALITATIVE    EKG None  Radiology CT Maxillofacial WO CM Result Date: 09/09/2023 CLINICAL DATA:  facial pain; Headache, increasing frequency or severity Pt arrives with c/o headache for a week states pain is getting worse everyday, denies any fevers. Pt recently had strep and was antibiotics for same. EXAM: CT HEAD WITHOUT CONTRAST CT MAXILLOFACIAL WITHOUT CONTRAST TECHNIQUE: Multidetector CT imaging of the head and maxillofacial structures were performed using the standard protocol without intravenous contrast. Multiplanar CT image reconstructions of the maxillofacial structures were also generated. RADIATION DOSE REDUCTION: This exam was performed according to the departmental dose-optimization program which includes automated exposure control, adjustment of the mA and/or kV according to patient size and/or use of iterative reconstruction technique. COMPARISON:  None Available. FINDINGS: CT HEAD FINDINGS Brain: No evidence of large-territorial acute infarction. No parenchymal hemorrhage. No mass lesion. No extra-axial collection. No mass effect or midline shift. No hydrocephalus. Basilar cisterns are patent. Vascular: No hyperdense vessel. Skull: No acute fracture or focal lesion. Sinuses/Orbits: Paranasal sinuses and mastoid air cells are clear. The orbits are unremarkable. Other: None. CT MAXILLOFACIAL FINDINGS Osseous: No fracture or mandibular dislocation. No destructive process. Sinuses/Orbits: Bilateral maxillary, bilateral ethmoid, right frontal mucosal thickening. Otherwise paranasal sinuses and mastoid air cells are clear. The orbits are unremarkable. Soft tissues: Negative. IMPRESSION: 1. No acute intracranial abnormality. 2.  No acute displaced facial fracture. Electronically Signed   By: Tish Frederickson M.D.   On: 09/09/2023 22:06   CT Head Wo Contrast Result Date: 09/09/2023 CLINICAL DATA:  facial pain; Headache,  increasing frequency or severity Pt arrives with c/o headache for a week states pain is getting worse everyday, denies any fevers. Pt recently had strep and was antibiotics for same. EXAM: CT HEAD WITHOUT CONTRAST CT MAXILLOFACIAL WITHOUT CONTRAST TECHNIQUE: Multidetector CT imaging of the head and maxillofacial structures were performed using the standard protocol without intravenous contrast. Multiplanar CT image reconstructions of the maxillofacial structures were also generated. RADIATION DOSE REDUCTION: This exam was performed according to the departmental dose-optimization program which includes automated exposure control, adjustment of the mA and/or kV according to patient size and/or use of iterative reconstruction technique. COMPARISON:  None Available. FINDINGS: CT HEAD FINDINGS Brain: No evidence of large-territorial acute infarction. No parenchymal hemorrhage. No mass lesion. No extra-axial collection. No mass effect or midline shift. No hydrocephalus. Basilar cisterns are patent. Vascular: No hyperdense vessel. Skull: No acute fracture or focal lesion. Sinuses/Orbits: Paranasal sinuses and mastoid air  cells are clear. The orbits are unremarkable. Other: None. CT MAXILLOFACIAL FINDINGS Osseous: No fracture or mandibular dislocation. No destructive process. Sinuses/Orbits: Bilateral maxillary, bilateral ethmoid, right frontal mucosal thickening. Otherwise paranasal sinuses and mastoid air cells are clear. The orbits are unremarkable. Soft tissues: Negative. IMPRESSION: 1. No acute intracranial abnormality. 2.  No acute displaced facial fracture. Electronically Signed   By: Tish Frederickson M.D.   On: 09/09/2023 22:06    Procedures Procedures    Medications Ordered in ED Medications  acetaminophen (TYLENOL) tablet 1,000 mg (1,000 mg Oral Given 09/09/23 2016)  sodium chloride 0.9 % bolus 1,000 mL ( Intravenous Stopped 09/09/23 2336)  ketorolac (TORADOL) 15 MG/ML injection 15 mg (15 mg Intravenous  Given 09/09/23 2217)  metoCLOPramide (REGLAN) injection 10 mg (10 mg Intravenous Given 09/09/23 2217)    ED Course/ Medical Decision Making/ A&P                                 Medical Decision Making Amount and/or Complexity of Data Reviewed Labs: ordered. Radiology: ordered.  Risk OTC drugs. Prescription drug management.   Initial Impression and Ddx 33 year old well-appearing female presenting for headache.  Exam was unremarkable.  DDx includes stroke, meningitis, migraine, electrolyte derangement, URI, sinusitis, other. Patient PMH that increases complexity of ED encounter:  none  Interpretation of Diagnostics - I independent reviewed and interpreted the labs as followed: mild anemia  - I independently visualized the following imaging with scope of interpretation limited to determining acute life threatening conditions related to emergency care: CT head and max/face, which revealed no acute findings  Patient Reassessment and Ultimate Disposition/Management On reassessment, patient stated that headache had resolved and she endorsed strong desire to go home.  Considered stroke but unlikely given reassuring CT scan.  Also considered meningitis but unlikely given lack of meningismal symptoms and workup does not suggest active infection.  Suspect this is a migraine.  Advised conservative treatment at home.  Advised follow-up PCP.  Discussed return precautions.  Sent Zofran to her pharmacy at her request.  Discharged in good condition.  Patient management required discussion with the following services or consulting groups:  None  Complexity of Problems Addressed Acute complicated illness or Injury  Additional Data Reviewed and Analyzed Further history obtained from: Past medical history and medications listed in the EMR and Prior ED visit notes  Patient Encounter Risk Assessment Prescriptions         Final Clinical Impression(s) / ED Diagnoses Final diagnoses:   Nonintractable headache, unspecified chronicity pattern, unspecified headache type    Rx / DC Orders ED Discharge Orders          Ordered    ondansetron (ZOFRAN) 4 MG tablet  Every 6 hours        09/09/23 2346              Gareth Eagle, PA-C 09/09/23 2347    Vanetta Mulders, MD 09/12/23 1630

## 2023-09-09 NOTE — ED Provider Triage Note (Signed)
 Emergency Medicine Provider Triage Evaluation Note  Brianna Maddox , a 33 y.o. female  was evaluated in triage.  Pt complains of headache. Started a week ago. Originated in the RUQ of face and radiates to hack of the head. Recent d/x of strep. Also endorsing nasal congestion.  Review of Systems  Positive: See above Negative: See above  Physical Exam  BP 110/72   Pulse 60   Temp 98.9 F (37.2 C)   Resp 16   Ht 5\' 6"  (1.676 m)   Wt 49.9 kg   LMP 09/08/2023   SpO2 98%   BMI 17.75 kg/m  Gen:   Awake, no distress   Resp:  Normal effort  MSK:   Moves extremities without difficulty  Other:    Medical Decision Making  Medically screening exam initiated at 8:06 PM.  Appropriate orders placed.  Brianna Maddox was informed that the remainder of the evaluation will be completed by another provider, this initial triage assessment does not replace that evaluation, and the importance of remaining in the ED until their evaluation is complete.  Work up started   Gareth Eagle, Cordelia Poche 09/09/23 2007

## 2023-09-14 ENCOUNTER — Ambulatory Visit

## 2023-09-17 ENCOUNTER — Encounter: Payer: Self-pay | Admitting: Student

## 2023-09-17 ENCOUNTER — Ambulatory Visit (INDEPENDENT_AMBULATORY_CARE_PROVIDER_SITE_OTHER): Admitting: Student

## 2023-09-17 VITALS — BP 104/68 | HR 74 | Ht 66.0 in | Wt 106.2 lb

## 2023-09-17 DIAGNOSIS — H547 Unspecified visual loss: Secondary | ICD-10-CM | POA: Diagnosis present

## 2023-09-17 NOTE — Patient Instructions (Addendum)
 It was great to see you today! Thank you for choosing [facility] for your primary care.  Today we addressed: Ophthalmology appointment tomorrow Come back to see me to discuss sinuses. Recommend trialing Flonase daily.  If you haven't already, sign up for My Chart to have easy access to your labs results, and communication with your primary care physician.   Please arrive 15 minutes before your appointment to ensure smooth check in process.  We appreciate your efforts in making this happen.  Thank you for allowing me to participate in your care, Shelby Mattocks, DO 09/17/2023, 12:15 PM PGY-3, [facility]

## 2023-09-17 NOTE — Progress Notes (Unsigned)
  SUBJECTIVE:   CHIEF COMPLAINT / HPI:   Presents today for follow-up appointment regarding strep pharyngitis diagnosis.  PERTINENT  PMH / PSH: Depression, asthma  OBJECTIVE:  BP 104/68   Pulse 74   Ht 5\' 6"  (1.676 m)   Wt 106 lb 3.2 oz (48.2 kg)   LMP 09/08/2023   SpO2 100%   BMI 17.14 kg/m  ***  ASSESSMENT/PLAN:   Assessment & Plan  No follow-ups on file. Shelby Mattocks, DO 09/17/2023, 12:15 PM PGY-3, [facility] {    This will disappear when note is signed, click to select method of visit    :1}  History of Present Illness Brianna Maddox is a 33 year old female who presents with persistent headaches and migraines following a recent strep infection.  Headaches began a few weeks ago, coinciding with a strep infection diagnosis, and have been severe, affecting daily life. Treated with antibiotics and prednisone for a week, but pain worsened, leading to urgent care and ER visits on September 09, 2023, where a migraine cocktail provided temporary relief.  Currently, she experiences a baseline headache daily, escalating into severe migraines if triggered. Pain starts around the right eye, spreading to the right side of the head and jaw, with pressure on teeth. Severe photophobia is present, with her eyes feeling strained and 'glitching.'  Visual disturbances in the right eye include orbs of light, occasional blurriness, and diplopia. Nausea without emesis and difficulty sleeping occur, with pain sometimes waking her at night. She wears glasses for hyperopia and astigmatism but did not have them during the visit.  She takes two extra strength Tylenol and two ibuprofen daily, which help her function but do not resolve the headache.  Physical Exam NEUROLOGICAL: Pain on leftward gaze.  Assessment and Plan Chronic Migraine with Visual Disturbances She has been experiencing severe right-sided headaches for over a week, initially thought to be related to a recent  streptococcal infection. Symptoms include photophobia, visual disturbances in the right eye, and nausea without vomiting. Over-the-counter analgesics like acetaminophen and ibuprofen provide only partial relief. The headaches impair daily functioning and sleep. Chronic migraine is suspected due to symptomatology and inadequate response to initial treatments. Visual disturbances include orbs of light, blurriness, and diplopia, with ocular pain on leftward gaze, persisting for about a month. An urgent ophthalmology referral is necessary to evaluate visual symptoms and exclude serious underlying conditions. - Refer to ophthalmologist for urgent evaluation of visual symptoms - Recommend continued use of over-the-counter analgesics as needed

## 2023-09-18 DIAGNOSIS — H547 Unspecified visual loss: Secondary | ICD-10-CM | POA: Insufficient documentation

## 2023-09-18 NOTE — Assessment & Plan Note (Signed)
 Acute potentially worsening in last 3 weeks, right eye with accompanying flashing lights, diplopia and headaches. Additionally photophobia and phonophobia. Suspect headaches to be result of this rather than cause however may be sequela of migraine. An urgent ophthalmology referral is necessary to evaluate visual symptoms and exclude serious underlying conditions. I spoke on the phone with her uncle Dr. Emily Filbert who is an ophthalmologist in [facility]. He agreed with urgent referral, they will see her tomorrow for evaluation. Should this prove unremarkable, may need to have further discussion regarding migraine management.

## 2023-12-08 ENCOUNTER — Encounter: Payer: Self-pay | Admitting: *Deleted

## 2024-03-25 ENCOUNTER — Other Ambulatory Visit: Payer: Self-pay | Admitting: Family Medicine

## 2024-04-14 ENCOUNTER — Telehealth: Payer: Self-pay

## 2024-04-14 NOTE — Telephone Encounter (Signed)
 Patient LVM on nurse line requesting returned call. She reports that she has been feeling nauseous since Saturday.   Returned call to patient. She did not answer, LVM asking her to call back to office to gather more information.   Chiquita JAYSON English, RN

## 2024-07-21 ENCOUNTER — Telehealth: Payer: Self-pay

## 2024-07-21 MED ORDER — FLUCONAZOLE 150 MG PO TABS: 150.0000 mg | ORAL_TABLET | Freq: Once | ORAL | 2 refills | Status: AC

## 2024-07-21 NOTE — Telephone Encounter (Signed)
 Patient calls nurse line requesting treatment for yeast infection.   She was seen at [facility] urgent care on Monday for a sinus infection, in which she was prescribed Augmentin.   Since being on the abx, patient has developed yeast infection. She reports significant vaginal irritation and white, clumpy discharge.   She reports that she frequently gets yeast infection when on antibiotics.   She states that she is allergic to OTC monistat. Reports that fluconazole  has worked well in the past, however, she usually needs at least two pills for treatment.   Patient is asking if she can receive prescription without coming into the office.   Will forward request to PCP.   Chiquita JAYSON English, RN

## 2024-07-21 NOTE — Telephone Encounter (Signed)
 Will send in fluconazole  for treatment of yeast infection.   Patient will need to schedule an appointment for physical in the next 2 months.   Damien Pinal, DO [facility], PGY-3 07/21/24 3:57 PM

## 2024-07-21 NOTE — Telephone Encounter (Signed)
 Attempted to call patient to provide update.   She did not answer, will send mychart message.   Chiquita JAYSON English, RN

## 2024-08-02 ENCOUNTER — Ambulatory Visit: Admitting: Family Medicine

## 2024-08-02 ENCOUNTER — Other Ambulatory Visit (HOSPITAL_COMMUNITY): Admission: RE | Admit: 2024-08-02 | Discharge: 2024-08-02 | Disposition: A | Source: Ambulatory Visit

## 2024-08-02 ENCOUNTER — Encounter: Payer: Self-pay | Admitting: Family Medicine

## 2024-08-02 VITALS — BP 112/74 | HR 80 | Ht 66.0 in | Wt 113.2 lb

## 2024-08-02 DIAGNOSIS — Z124 Encounter for screening for malignant neoplasm of cervix: Secondary | ICD-10-CM

## 2024-08-02 DIAGNOSIS — Z113 Encounter for screening for infections with a predominantly sexual mode of transmission: Secondary | ICD-10-CM | POA: Diagnosis present

## 2024-08-02 LAB — POCT WET PREP (WET MOUNT)
Clue Cells Wet Prep Whiff POC: NEGATIVE
Trichomonas Wet Prep HPF POC: ABSENT
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
## 2024-08-02 NOTE — Patient Instructions (Addendum)
 It was great to see you today! Thank you for choosing [facility] for your primary care. Brianna Maddox was seen for STD testing.  Please bring ALL of your medications with you to every visit.   Today we addressed: STD testing - wet prep was negative, we tested for additional STD on your pap smear, will follow up results. You can try RepHresh over the counter its a vaginal gel that could help as well. Contraception options discussed IUD, Nexplanon, oral contraceptive pills Follow up for annual visit  We are checking some labs today. I will send you a MyChart message with your results, per your preference. If you do not hear about your labs in the next 2 weeks, please call the office.   You should return to our clinic Return in about 1 month (around 08/30/2024) for annual physical. Please arrive 15 minutes before your appointment to ensure smooth check in process.  We appreciate your efforts in making this happen.  Thank you for allowing me to participate in your care, Kathrine Melena, DO 08/02/2024, 3:02 PM PGY-2, [facility]

## 2024-08-03 ENCOUNTER — Ambulatory Visit: Payer: Self-pay | Admitting: Family Medicine

## 2024-08-03 LAB — CYTOLOGY - PAP
Chlamydia: NEGATIVE
Comment: NEGATIVE
Comment: NEGATIVE
Comment: NORMAL
Diagnosis: NEGATIVE
Neisseria Gonorrhea: NEGATIVE
Trichomonas: NEGATIVE
